# Patient Record
Sex: Female | Born: 1950 | Race: White | Hispanic: No | Smoking: Never smoker
Health system: Southern US, Community
[De-identification: ages and names within clinical notes are randomized; demographics above are authoritative.]

## PROBLEM LIST (undated history)

## (undated) DIAGNOSIS — R030 Elevated blood-pressure reading, without diagnosis of hypertension: Secondary | ICD-10-CM

## (undated) DIAGNOSIS — D571 Sickle-cell disease without crisis: Secondary | ICD-10-CM

## (undated) DIAGNOSIS — R739 Hyperglycemia, unspecified: Secondary | ICD-10-CM

## (undated) DIAGNOSIS — T7840XA Allergy, unspecified, initial encounter: Secondary | ICD-10-CM

## (undated) DIAGNOSIS — E785 Hyperlipidemia, unspecified: Secondary | ICD-10-CM

## (undated) DIAGNOSIS — Z8619 Personal history of other infectious and parasitic diseases: Secondary | ICD-10-CM

## (undated) DIAGNOSIS — E669 Obesity, unspecified: Secondary | ICD-10-CM

## (undated) HISTORY — DX: Obesity, unspecified: E66.9

## (undated) HISTORY — DX: Hyperlipidemia, unspecified: E78.5

## (undated) HISTORY — DX: Elevated blood-pressure reading, without diagnosis of hypertension: R03.0

## (undated) HISTORY — DX: Sickle-cell disease without crisis: D57.1

## (undated) HISTORY — DX: Hyperglycemia, unspecified: R73.9

## (undated) HISTORY — DX: Allergy, unspecified, initial encounter: T78.40XA

## (undated) HISTORY — DX: Personal history of other infectious and parasitic diseases: Z86.19

---
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
## 1993-05-07 HISTORY — PX: BUNIONECTOMY: SHX129

## 2008-11-24 ENCOUNTER — Other Ambulatory Visit: Admission: RE | Admit: 2008-11-24 | Discharge: 2008-11-24 | Payer: Self-pay | Admitting: Family Medicine

## 2008-12-02 ENCOUNTER — Encounter: Admission: RE | Admit: 2008-12-02 | Discharge: 2008-12-02 | Payer: Self-pay | Admitting: Family Medicine

## 2009-01-13 LAB — HM PAP SMEAR: HM PAP: NORMAL

## 2009-01-13 LAB — HM MAMMOGRAPHY: HM Mammogram: NORMAL

## 2011-05-08 HISTORY — PX: WRIST SURGERY: SHX841

## 2015-01-14 ENCOUNTER — Ambulatory Visit (INDEPENDENT_AMBULATORY_CARE_PROVIDER_SITE_OTHER): Payer: BC Managed Care – PPO | Admitting: Family Medicine

## 2015-01-14 ENCOUNTER — Encounter: Payer: Self-pay | Admitting: Family Medicine

## 2015-01-14 VITALS — BP 132/82 | HR 82 | Temp 98.6°F | Ht 69.0 in | Wt 268.5 lb

## 2015-01-14 DIAGNOSIS — Z23 Encounter for immunization: Secondary | ICD-10-CM

## 2015-01-14 DIAGNOSIS — R05 Cough: Secondary | ICD-10-CM

## 2015-01-14 DIAGNOSIS — E669 Obesity, unspecified: Secondary | ICD-10-CM

## 2015-01-14 DIAGNOSIS — R030 Elevated blood-pressure reading, without diagnosis of hypertension: Secondary | ICD-10-CM | POA: Diagnosis not present

## 2015-01-14 DIAGNOSIS — T7840XS Allergy, unspecified, sequela: Secondary | ICD-10-CM

## 2015-01-14 DIAGNOSIS — Z1239 Encounter for other screening for malignant neoplasm of breast: Secondary | ICD-10-CM

## 2015-01-14 DIAGNOSIS — E785 Hyperlipidemia, unspecified: Secondary | ICD-10-CM | POA: Diagnosis not present

## 2015-01-14 DIAGNOSIS — Z8619 Personal history of other infectious and parasitic diseases: Secondary | ICD-10-CM

## 2015-01-14 DIAGNOSIS — R739 Hyperglycemia, unspecified: Secondary | ICD-10-CM

## 2015-01-14 DIAGNOSIS — Z0000 Encounter for general adult medical examination without abnormal findings: Secondary | ICD-10-CM | POA: Diagnosis not present

## 2015-01-14 DIAGNOSIS — IMO0001 Reserved for inherently not codable concepts without codable children: Secondary | ICD-10-CM

## 2015-01-14 DIAGNOSIS — R059 Cough, unspecified: Secondary | ICD-10-CM

## 2015-01-14 HISTORY — DX: Obesity, unspecified: E66.9

## 2015-01-14 LAB — COMPREHENSIVE METABOLIC PANEL
ALBUMIN: 4.5 g/dL (ref 3.5–5.2)
ALK PHOS: 97 U/L (ref 39–117)
ALT: 44 U/L — AB (ref 0–35)
AST: 25 U/L (ref 0–37)
BILIRUBIN TOTAL: 0.5 mg/dL (ref 0.2–1.2)
BUN: 14 mg/dL (ref 6–23)
CALCIUM: 9.3 mg/dL (ref 8.4–10.5)
CO2: 26 mEq/L (ref 19–32)
CREATININE: 0.82 mg/dL (ref 0.40–1.20)
Chloride: 103 mEq/L (ref 96–112)
GFR: 74.92 mL/min (ref >60.00)
Glucose, Bld: 110 mg/dL — ABNORMAL HIGH (ref 70–99)
Potassium: 3.9 mEq/L (ref 3.5–5.1)
Sodium: 139 mEq/L (ref 135–145)
TOTAL PROTEIN: 7.4 g/dL (ref 6.0–8.3)

## 2015-01-14 LAB — LIPID PANEL
CHOL/HDL RATIO: 3
CHOLESTEROL: 175 mg/dL (ref 0–200)
HDL: 50.9 mg/dL (ref >39.00)
LDL Cholesterol: 108 mg/dL — ABNORMAL HIGH (ref 0–99)
NonHDL: 123.7
TRIGLYCERIDES: 79 mg/dL (ref 0.0–149.0)
VLDL: 15.8 mg/dL (ref 0.0–40.0)

## 2015-01-14 LAB — CBC
HEMATOCRIT: 45.9 % (ref 36.0–46.0)
HEMOGLOBIN: 15.6 g/dL — AB (ref 12.0–15.0)
MCHC: 34 g/dL (ref 30.0–36.0)
MCV: 86.7 fl (ref 78.0–100.0)
PLATELETS: 308 10*3/uL (ref 150.0–400.0)
RBC: 5.3 Mil/uL — ABNORMAL HIGH (ref 3.87–5.11)
RDW: 13.5 % (ref 11.5–15.5)
WBC: 9 10*3/uL (ref 4.0–10.5)

## 2015-01-14 LAB — TSH: TSH: 2.22 u[IU]/mL (ref 0.35–4.50)

## 2015-01-14 NOTE — Progress Notes (Signed)
Pre visit review using our clinic review tool, if applicable. No additional management support is needed unless otherwise documented below in the visit note. 

## 2015-01-14 NOTE — Patient Instructions (Addendum)
Zantac/Ranitidine 150 mg once to twice daily Zyrtec?cetirizine 10 mg daily (for severe allergies can take twice daily) Nasal saline flushes twice daily  Probiotics daily [employer] or [facility] or online at [email or web address], [place] 10 strain daily Calcium 3 x daily (Citracal)  Vitamin D 2000 IU daily  DASH Eating Plan DASH stands for "Dietary Approaches to Stop Hypertension." The DASH eating plan is a healthy eating plan that has been shown to reduce high blood pressure (hypertension). Additional health benefits may include reducing the risk of type 2 diabetes mellitus, heart disease, and stroke. The DASH eating plan may also help with weight loss. WHAT DO I NEED TO KNOW ABOUT THE DASH EATING PLAN? For the DASH eating plan, you will follow these general guidelines:  Choose foods with a percent daily value for sodium of less than 5% (as listed on the food label).  Use salt-free seasonings or herbs instead of table salt or sea salt.  Check with your health care provider or pharmacist before using salt substitutes.  Eat lower-sodium products, often labeled as "lower sodium" or "no salt added."  Eat fresh foods.  Eat more vegetables, fruits, and low-fat dairy products.  Choose whole grains. Look for the word "whole" as the first word in the ingredient list.  Choose fish and skinless chicken or [place] more often than red meat. Limit fish, poultry, and meat to 6 oz (170 g) each day.  Limit sweets, desserts, sugars, and sugary drinks.  Choose heart-healthy fats.  Limit cheese to 1 oz (28 g) per day.  Eat more home-cooked food and less restaurant, buffet, and fast food.  Limit fried foods.  Cook foods using methods other than frying.  Limit canned vegetables. If you do use them, rinse them well to decrease the sodium.  When eating at a restaurant, ask that your food be prepared with less salt, or no salt if possible. WHAT FOODS CAN I EAT? Seek help from  a dietitian for individual calorie needs. Grains Whole grain or whole wheat bread. Brown rice. Whole grain or whole wheat pasta. Quinoa, bulgur, and whole grain cereals. Low-sodium cereals. Corn or whole wheat flour tortillas. Whole grain cornbread. Whole grain crackers. Low-sodium crackers. Vegetables Fresh or frozen vegetables (raw, steamed, roasted, or grilled). Low-sodium or reduced-sodium tomato and vegetable juices. Low-sodium or reduced-sodium tomato sauce and paste. Low-sodium or reduced-sodium canned vegetables.  Fruits All fresh, canned (in natural juice), or frozen fruits. Meat and Other Protein Products Ground beef (85% or leaner), grass-fed beef, or beef trimmed of fat. Skinless chicken or [place]. Ground chicken or [place]. Pork trimmed of fat. All fish and seafood. Eggs. Dried beans, peas, or lentils. Unsalted nuts and seeds. Unsalted canned beans. Dairy Low-fat dairy products, such as skim or 1% milk, 2% or reduced-fat cheeses, low-fat ricotta or cottage cheese, or plain low-fat yogurt. Low-sodium or reduced-sodium cheeses. Fats and Oils Tub margarines without trans fats. Light or reduced-fat mayonnaise and salad dressings (reduced sodium). Avocado. Safflower, olive, or canola oils. Natural peanut or almond butter. Other Unsalted popcorn and pretzels. The items listed above may not be a complete list of recommended foods or beverages. Contact your dietitian for more options. WHAT FOODS ARE NOT RECOMMENDED? Grains White bread. White pasta. White rice. Refined cornbread. Bagels and croissants. Crackers that contain trans fat. Vegetables Creamed or fried vegetables. Vegetables in a cheese sauce. Regular canned vegetables. Regular canned tomato sauce and paste. Regular tomato and vegetable juices. Fruits Dried fruits. Canned fruit in light  or heavy syrup. Fruit juice. Meat and Other Protein Products Fatty cuts of meat. Ribs, chicken wings, bacon, sausage, bologna, salami,  chitterlings, fatback, hot dogs, bratwurst, and packaged luncheon meats. Salted nuts and seeds. Canned beans with salt. Dairy Whole or 2% milk, cream, half-and-half, and cream cheese. Whole-fat or sweetened yogurt. Full-fat cheeses or blue cheese. Nondairy creamers and whipped toppings. Processed cheese, cheese spreads, or cheese curds. Condiments Onion and garlic salt, seasoned salt, table salt, and sea salt. Canned and packaged gravies. Worcestershire sauce. Tartar sauce. Barbecue sauce. Teriyaki sauce. Soy sauce, including reduced sodium. Steak sauce. Fish sauce. Oyster sauce. Cocktail sauce. Horseradish. Ketchup and mustard. Meat flavorings and tenderizers. Bouillon cubes. Hot sauce. Tabasco sauce. Marinades. Taco seasonings. Relishes. Fats and Oils Butter, stick margarine, lard, shortening, ghee, and bacon fat. Coconut, palm kernel, or palm oils. Regular salad dressings. Other Pickles and olives. Salted popcorn and pretzels. The items listed above may not be a complete list of foods and beverages to avoid. Contact your dietitian for more information. WHERE CAN I FIND MORE INFORMATION? National Heart, Lung, and Blood Institute: [employer] Document Released: 04/12/2011 Document Revised: 09/07/2013 Document Reviewed: 02/25/2013 [facility] Patient Information 2015 [place], [place]. This information is not intended to replace advice given to you by your health care provider. Make sure you discuss any questions you have with your health care provider.

## 2015-01-21 ENCOUNTER — Telehealth: Payer: Self-pay

## 2015-01-21 NOTE — Telephone Encounter (Signed)
-----   Message from Mosie Lukes, MD sent at 01/16/2015  5:02 PM EDT ----- Notify labs look good, mild elevation of cholesterol, liver marker and sugar. Treatment for all 3 is to minimize simple carbs and increase exercise. Would check hgba1c and cmp in 1 month for hyperglycemia and abnormal liver functions.

## 2015-01-21 NOTE — Telephone Encounter (Signed)
Stated already notified of results.

## 2015-01-24 ENCOUNTER — Ambulatory Visit (HOSPITAL_BASED_OUTPATIENT_CLINIC_OR_DEPARTMENT_OTHER)
Admission: RE | Admit: 2015-01-24 | Discharge: 2015-01-24 | Disposition: A | Discharge status: Home / Self Care | Payer: BC Managed Care – PPO | Source: Ambulatory Visit | Attending: Family Medicine | Admitting: Family Medicine

## 2015-01-24 DIAGNOSIS — Z1231 Encounter for screening mammogram for malignant neoplasm of breast: Secondary | ICD-10-CM | POA: Diagnosis not present

## 2015-01-24 DIAGNOSIS — Z1239 Encounter for other screening for malignant neoplasm of breast: Secondary | ICD-10-CM

## 2015-01-30 ENCOUNTER — Encounter: Payer: Self-pay | Admitting: Family Medicine

## 2015-01-30 DIAGNOSIS — R739 Hyperglycemia, unspecified: Secondary | ICD-10-CM

## 2015-01-30 DIAGNOSIS — R030 Elevated blood-pressure reading, without diagnosis of hypertension: Secondary | ICD-10-CM

## 2015-01-30 DIAGNOSIS — E785 Hyperlipidemia, unspecified: Secondary | ICD-10-CM | POA: Insufficient documentation

## 2015-01-30 DIAGNOSIS — Z8619 Personal history of other infectious and parasitic diseases: Secondary | ICD-10-CM | POA: Insufficient documentation

## 2015-01-30 DIAGNOSIS — T7840XA Allergy, unspecified, initial encounter: Secondary | ICD-10-CM | POA: Insufficient documentation

## 2015-01-30 DIAGNOSIS — IMO0001 Reserved for inherently not codable concepts without codable children: Secondary | ICD-10-CM | POA: Insufficient documentation

## 2015-01-30 HISTORY — DX: Hyperlipidemia, unspecified: E78.5

## 2015-01-30 HISTORY — DX: Hyperglycemia, unspecified: R73.9

## 2015-01-30 HISTORY — DX: Reserved for inherently not codable concepts without codable children: IMO0001

## 2015-01-30 NOTE — Assessment & Plan Note (Signed)
Encouraged DASH diet, decrease po intake and increase exercise as tolerated. Needs 7-8 hours of sleep nightly. Avoid trans fats, eat small, frequent meals every 4-5 hours with lean proteins, complex carbs and healthy fats. Minimize simple carbs, GMO foods. 

## 2015-01-30 NOTE — Assessment & Plan Note (Signed)
Encouraged daily antihistamines, nasal steroids, nasal saline.

## 2015-01-30 NOTE — Assessment & Plan Note (Signed)
Encouraged heart healthy diet, increase exercise, avoid trans fats, consider a krill oil cap daily 

## 2015-01-30 NOTE — Assessment & Plan Note (Signed)
Significantly improved on recheck. Encouraged heart healthy diet such as the DASH diet and exercise as tolerated.

## 2015-01-30 NOTE — Assessment & Plan Note (Signed)
minimize simple carbs. Increase exercise as tolerated.  

## 2015-01-30 NOTE — Progress Notes (Signed)
Renee Larson [id number] October 11, 1950 01/30/2015      Progress Note New Patient  Subjective  Chief Complaint  Chief Complaint  Patient presents with  . Establish Care    HPI  Patient is a 64 year old female in today for routine medical care. 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 She reports generally being in good health and denies a history of hypertension. She reports allergies have been worse since returning home with some increased postnasal drip in the morning and a cough productive at times. It is been bad enough to cause some posttussive vomiting at times. She reports a history of struggles with weight and has lost 80 pounds over 20 years by avoiding processed foods. Suffered a broken wrist about 3 years ago but no pain is residual. 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 Has a history of chickenpox as a child and has not received the Zostavax thus far. She reports having had a colonoscopy that was normal in [place] roughly 10 years ago. No GI complaints. She feels well today. Denies CP/palp/SOB/HA/congestion/fevers/GI or GU c/o. Taking meds as prescribed  Past Medical History  Diagnosis Date  . Allergy   . Obesity 01/14/2015    Past Surgical History  Procedure Laterality Date  . Cesarean section  1981  . Cesarean section  1988  . Tonsillectomy  1960  . Bunionectomy  1995  . Wrist surgery  2013    Broken right wrist    Family History  Problem Relation Age of Onset  . Cancer Mother     thyroid cancer. Mom a smoker  . Cancer Father     prostate cancer. Dad A smoker  . Cancer Maternal Aunt     Breast Cancer  . Hyperlipidemia Maternal Aunt     Social History   Social History  . Marital Status: Married    Spouse Name: N/A  . Number of Children: N/A  . Years of Education: 15   Occupational History  . Retired Licensed conveyancer    Social History Main Topics   . Smoking status: Never Smoker   . Smokeless tobacco: Not on file  . Alcohol Use: No  . Drug Use: No  . Sexual Activity:    Partners: Male     Comment: Husband, no dietary restrictins heart healthy diet, retired Proofreader   Other Topics Concern  . Not on file   Social History Narrative  . No narrative on file    No current outpatient prescriptions on file prior to visit.   No current facility-administered medications on file prior to visit.    No Known Allergies  Review of Systems  Review of Systems  Constitutional: Negative for fever, chills and malaise/fatigue.  HENT: Positive for congestion. Negative for hearing loss.   Eyes: Negative for discharge.  Respiratory: Positive for cough. Negative for sputum production and shortness of breath.   Cardiovascular: Negative for chest pain, palpitations and leg swelling.  Gastrointestinal: Positive for nausea and vomiting. Negative for heartburn, abdominal pain, diarrhea, constipation and blood in stool.  Genitourinary: Negative for dysuria, urgency, frequency and hematuria.  Musculoskeletal: Negative for myalgias, back pain and falls.  Skin: Negative for rash.  Neurological: Negative for dizziness, sensory change, loss of consciousness, weakness and headaches.  Endo/Heme/Allergies: Negative for environmental allergies. Does not bruise/bleed easily.  Psychiatric/Behavioral: Negative for depression and suicidal ideas. The patient is  not nervous/anxious and does not have insomnia.     Objective  BP 162/98 mmHg  Pulse 97  Temp(Src) 98.6 F (37 C) (Oral)  Ht 5\' 9"  (1.753 m)  Wt 268 lb 8 oz (121.791 kg)  BMI 39.63 kg/m2  SpO2 95%  Physical Exam  Physical Exam  Constitutional: She is oriented to person, place, and time and well-developed, well-nourished, and in no distress. No distress.  HENT:  Head: Normocephalic and atraumatic.  Right Ear: External ear normal.  Left Ear: External ear normal.  Nose: Nose normal.   Mouth/Throat: Oropharynx is clear and moist. No oropharyngeal exudate.  Eyes: Conjunctivae are normal. Pupils are equal, round, and reactive to light. Right eye exhibits no discharge. Left eye exhibits no discharge. No scleral icterus.  Neck: Normal range of motion. Neck supple. No thyromegaly present.  Cardiovascular: Normal rate, regular rhythm, normal heart sounds and intact distal pulses.   No murmur heard. Pulmonary/Chest: Effort normal and breath sounds normal. No respiratory distress. She has no wheezes. She has no rales.  Abdominal: Soft. Bowel sounds are normal. She exhibits no distension and no mass. There is no tenderness.  Musculoskeletal: Normal range of motion. She exhibits no edema or tenderness.  Lymphadenopathy:    She has no cervical adenopathy.  Neurological: She is alert and oriented to person, place, and time. She has normal reflexes. No cranial nerve deficit. Coordination normal.  Skin: Skin is warm and dry. No rash noted. She is not diaphoretic.  Psychiatric: Mood, memory and affect normal.       Assessment & Plan  Obesity Encouraged DASH diet, decrease po intake and increase exercise as tolerated. Needs 7-8 hours of sleep nightly. Avoid trans fats, eat small, frequent meals every 4-5 hours with lean proteins, complex carbs and healthy fats. Minimize simple carbs, GMO foods.  Elevated BP Significantly improved on recheck. Encouraged heart healthy diet such as the DASH diet and exercise as tolerated.   History of chicken pox Given Zostavax and Tdap today  Allergy Encouraged daily antihistamines, nasal steroids, nasal saline.  Hyperlipidemia Encouraged heart healthy diet, increase exercise, avoid trans fats, consider a krill oil cap daily  Hyperglycemia minimize simple carbs. Increase exercise as tolerated.

## 2015-01-30 NOTE — Assessment & Plan Note (Signed)
Given Zostavax and Tdap today

## 2015-03-08 ENCOUNTER — Encounter: Payer: Self-pay | Admitting: Family Medicine

## 2015-03-08 ENCOUNTER — Ambulatory Visit (INDEPENDENT_AMBULATORY_CARE_PROVIDER_SITE_OTHER): Payer: BC Managed Care – PPO | Admitting: Family Medicine

## 2015-03-08 VITALS — BP 138/68 | HR 98 | Temp 99.4°F | Ht 69.0 in | Wt 269.2 lb

## 2015-03-08 DIAGNOSIS — K7689 Other specified diseases of liver: Secondary | ICD-10-CM

## 2015-03-08 DIAGNOSIS — R945 Abnormal results of liver function studies: Secondary | ICD-10-CM

## 2015-03-08 DIAGNOSIS — R739 Hyperglycemia, unspecified: Secondary | ICD-10-CM | POA: Diagnosis not present

## 2015-03-08 DIAGNOSIS — R030 Elevated blood-pressure reading, without diagnosis of hypertension: Secondary | ICD-10-CM | POA: Diagnosis not present

## 2015-03-08 DIAGNOSIS — IMO0001 Reserved for inherently not codable concepts without codable children: Secondary | ICD-10-CM

## 2015-03-08 DIAGNOSIS — T7840XS Allergy, unspecified, sequela: Secondary | ICD-10-CM

## 2015-03-08 DIAGNOSIS — E782 Mixed hyperlipidemia: Secondary | ICD-10-CM | POA: Diagnosis not present

## 2015-03-08 NOTE — Patient Instructions (Signed)
Nonalcoholic Fatty Liver Disease Diet Nonalcoholic fatty liver disease is a condition that causes fat to accumulate in and around the liver. The disease makes it harder for the liver to work the way that it should. Following a healthy diet can help to keep nonalcoholic fatty liver disease under control. It can also help to prevent or improve conditions that are associated with the disease, such as heart disease, diabetes, high blood pressure, and abnormal cholesterol levels. Along with regular exercise, this diet:  Promotes weight loss.  Helps to control blood sugar levels.  Helps to improve the way that the body uses insulin. WHAT DO I NEED TO KNOW ABOUT THIS DIET?  Use the glycemic index (GI) to plan your meals. The index tells you how quickly a food will raise your blood sugar. Choose low-GI foods. These foods take a longer time to raise blood sugar.  Keep track of how many calories you take in. Eating the right amount of calories will help you to achieve a healthy weight.  You may want to follow a Mediterranean diet. This diet includes a lot of vegetables, lean meats or fish, whole grains, fruits, and healthy oils and fats. WHAT FOODS CAN I EAT? Grains Whole grains, such as whole-wheat or whole-grain breads, crackers, tortillas, cereals, and pasta. Stone-ground whole wheat. Pumpernickel bread. Unsweetened oatmeal. Bulgur. Barley. Quinoa. Brown or wild rice. Corn or whole-wheat flour tortillas. Vegetables Lettuce. Spinach. Peas. Beets. Cauliflower. Cabbage. Broccoli. Carrots. Tomatoes. Squash. Eggplant. Herbs. Peppers. Onions. Cucumbers. Brussels sprouts. Yams and sweet potatoes. Beans. Lentils. Fruits Bananas. Apples. Oranges. Grapes. Papaya. Mango. Pomegranate. Kiwi. Grapefruit. Cherries. Meats and Other Protein Sources Seafood and shellfish. Lean meats. Poultry. Tofu. Dairy Low-fat or fat-free dairy products, such as yogurt, cottage cheese, and cheese. Beverages Water. Sugar-free  drinks. Tea. Coffee. Low-fat or skim milk. Milk alternatives, such as soy or almond milk. Real fruit juice. Condiments Mustard. Relish. Low-fat, low-sugar ketchup and barbecue sauce. Low-fat or fat-free mayonnaise. Sweets and Desserts Sugar-free sweets. Fats and Oils Avocado. Canola or olive oil. Nuts and nut butters. Seeds. The items listed above may not be a complete list of recommended foods or beverages. Contact your dietitian for more options.  WHAT FOODS ARE NOT RECOMMENDED? Palm oil and coconut oil. Processed foods. Fried foods. Sweetened drinks, such as sweet tea, milkshakes, snow cones, iced sweet drinks, and sodas. Alcohol. Sweets. Foods that contain a lot of salt or sodium. The items listed above may not be a complete list of foods and beverages to avoid. Contact your dietitian for more information.   This information is not intended to replace advice given to you by your health care provider. Make sure you discuss any questions you have with your health care provider.   Document Released: 09/07/2014 Document Reviewed: 09/07/2014 Elsevier Interactive Patient Education 2016 [employer] Inc.  

## 2015-03-08 NOTE — Progress Notes (Signed)
Pre visit review using our clinic review tool, if applicable. No additional management support is needed unless otherwise documented below in the visit note. 

## 2015-03-20 NOTE — Progress Notes (Deleted)
Renee Larson [id number] 1950-06-06 03/20/2015      Progress Note New Patient  Subjective  Chief Complaint  Chief Complaint  Patient presents with  . Follow-up    blood pressure    HPI  ***  Past Medical History  Diagnosis Date  . Obesity 01/14/2015  . Allergy   . History of chicken pox   . Elevated BP 01/30/2015  . Hyperlipidemia 01/30/2015  . Hyperglycemia 01/30/2015    Past Surgical History  Procedure Laterality Date  . Cesarean section  1981  . Cesarean section  1988  . Tonsillectomy  1960  . Bunionectomy  1995  . Wrist surgery  2013    Broken right wrist    Family History  Problem Relation Age of Onset  . Cancer Mother     thyroid cancer. Mom a smoker  . Cancer Father     prostate cancer. Dad A smoker  . Cancer Maternal Aunt     Breast Cancer  . Hyperlipidemia Maternal Aunt     Social History   Social History  . Marital Status: Married    Spouse Name: N/A  . Number of Children: N/A  . Years of Education: 66   Occupational History  . Retired Licensed conveyancer    Social History Main Topics  . Smoking status: Never Smoker   . Smokeless tobacco: Not on file  . Alcohol Use: No  . Drug Use: No  . Sexual Activity:    Partners: Male     Comment: Husband, no dietary restrictins heart healthy diet, retired Proofreader   Other Topics Concern  . Not on file   Social History Narrative    Current Outpatient Prescriptions on File Prior to Visit  Medication Sig Dispense Refill  . Calcium Carbonate-Vitamin D (CALTRATE 600+D) 600-400 MG-UNIT per tablet Take 1 tablet by mouth daily.    . Multiple Vitamin (MULTIVITAMIN) tablet Take 1 tablet by mouth daily.     No current facility-administered medications on file prior to visit.    No Known Allergies  Review of Systems  ***  Objective  BP 138/68 mmHg  Pulse 98  Temp(Src) 99.4 F (37.4 C) (Oral)  Ht 5\' 9"  (1.753 m)  Wt 269 lb 4 oz (122.131 kg)  BMI 39.74 kg/m2  SpO2 99%  Physical  Exam  ***     Assessment & Plan  No problem-specific assessment & plan notes found for this encounter.

## 2015-03-20 NOTE — Assessment & Plan Note (Signed)
Zyrtec, nasal saline and nasal steroids daily

## 2015-03-20 NOTE — Progress Notes (Signed)
Subjective:    Patient ID: Renee Larson, female    DOB: November 27, 1950, 64 y.o.   MRN: [id number]  Chief Complaint  Patient presents with  . Follow-up    blood pressure    HPI Patient is in today for follow-up. Is doing fairly well but is noting some increased sinus pressure and congestion. No fevers or chills. No recent hospitalization. Has started calcium and probiotics and reports an improvement in her stomach symptoms. No acute concerns. Denies CP/palp/SOB/HA/fevers/GI or GU c/o. Taking meds as prescribed  Past Medical History  Diagnosis Date  . Obesity 01/14/2015  . Allergy   . History of chicken pox   . Elevated BP 01/30/2015  . Hyperlipidemia 01/30/2015  . Hyperglycemia 01/30/2015    Past Surgical History  Procedure Laterality Date  . Cesarean section  1981  . Cesarean section  1988  . Tonsillectomy  1960  . Bunionectomy  1995  . Wrist surgery  2013    Broken right wrist    Family History  Problem Relation Age of Onset  . Cancer Mother     thyroid cancer. Mom a smoker  . Cancer Father     prostate cancer. Dad A smoker  . Cancer Maternal Aunt     Breast Cancer  . Hyperlipidemia Maternal Aunt     Social History   Social History  . Marital Status: Married    Spouse Name: N/A  . Number of Children: N/A  . Years of Education: 68   Occupational History  . Retired Licensed conveyancer    Social History Main Topics  . Smoking status: Never Smoker   . Smokeless tobacco: Not on file  . Alcohol Use: No  . Drug Use: No  . Sexual Activity:    Partners: Male     Comment: Husband, no dietary restrictins heart healthy diet, retired Proofreader   Other Topics Concern  . Not on file   Social History Narrative    Outpatient Prescriptions Prior to Visit  Medication Sig Dispense Refill  . Calcium Carbonate-Vitamin D (CALTRATE 600+D) 600-400 MG-UNIT per tablet Take 1 tablet by mouth daily.    . Multiple Vitamin (MULTIVITAMIN) tablet Take 1 tablet by mouth daily.     No  facility-administered medications prior to visit.    No Known Allergies  Review of Systems  Constitutional: Negative for fever and malaise/fatigue.  HENT: Negative for congestion.   Eyes: Negative for discharge.  Respiratory: Negative for shortness of breath.   Cardiovascular: Negative for chest pain, palpitations and leg swelling.  Gastrointestinal: Negative for nausea and abdominal pain.  Genitourinary: Negative for dysuria.  Musculoskeletal: Negative for falls.  Skin: Negative for rash.  Neurological: Negative for loss of consciousness and headaches.  Endo/Heme/Allergies: Negative for environmental allergies.  Psychiatric/Behavioral: Negative for depression. The patient is not nervous/anxious.        Objective:    Physical Exam  Constitutional: She is oriented to person, place, and time. She appears well-developed and well-nourished. No distress.  HENT:  Head: Normocephalic and atraumatic.  Nose: Nose normal.  Eyes: Right eye exhibits no discharge. Left eye exhibits no discharge.  Neck: Normal range of motion. Neck supple.  Cardiovascular: Normal rate and regular rhythm.   No murmur heard. Pulmonary/Chest: Effort normal and breath sounds normal.  Abdominal: Soft. Bowel sounds are normal. There is no tenderness.  Musculoskeletal: She exhibits no edema.  Neurological: She is alert and oriented to person, place, and time.  Skin: Skin is warm and dry.  Psychiatric: She has a normal mood and affect.  Nursing note and vitals reviewed.   BP 138/68 mmHg  Pulse 98  Temp(Src) 99.4 F (37.4 C) (Oral)  Ht 5\' 9"  (1.753 m)  Wt 269 lb 4 oz (122.131 kg)  BMI 39.74 kg/m2  SpO2 99% Wt Readings from Last 3 Encounters:  03/08/15 269 lb 4 oz (122.131 kg)  01/14/15 268 lb 8 oz (121.791 kg)     Lab Results  Component Value Date   WBC 9.0 01/14/2015   HGB 15.6* 01/14/2015   HCT 45.9 01/14/2015   PLT 308.0 01/14/2015   GLUCOSE 110* 01/14/2015   CHOL 175 01/14/2015   TRIG 79.0  01/14/2015   HDL 50.90 01/14/2015   LDLCALC 108* 01/14/2015   ALT 44* 01/14/2015   AST 25 01/14/2015   NA 139 01/14/2015   K 3.9 01/14/2015   CL 103 01/14/2015   CREATININE 0.82 01/14/2015   BUN 14 01/14/2015   CO2 26 01/14/2015   TSH 2.22 01/14/2015    Lab Results  Component Value Date   TSH 2.22 01/14/2015   Lab Results  Component Value Date   WBC 9.0 01/14/2015   HGB 15.6* 01/14/2015   HCT 45.9 01/14/2015   MCV 86.7 01/14/2015   PLT 308.0 01/14/2015   Lab Results  Component Value Date   NA 139 01/14/2015   K 3.9 01/14/2015   CO2 26 01/14/2015   GLUCOSE 110* 01/14/2015   BUN 14 01/14/2015   CREATININE 0.82 01/14/2015   BILITOT 0.5 01/14/2015   ALKPHOS 97 01/14/2015   AST 25 01/14/2015   ALT 44* 01/14/2015   PROT 7.4 01/14/2015   ALBUMIN 4.5 01/14/2015   CALCIUM 9.3 01/14/2015   GFR 74.92 01/14/2015   Lab Results  Component Value Date   CHOL 175 01/14/2015   Lab Results  Component Value Date   HDL 50.90 01/14/2015   Lab Results  Component Value Date   LDLCALC 108* 01/14/2015   Lab Results  Component Value Date   TRIG 79.0 01/14/2015   Lab Results  Component Value Date   CHOLHDL 3 01/14/2015   No results found for: HGBA1C     Assessment & Plan:   Problem List Items Addressed This Visit    Hyperglycemia     minimize simple carbs. Increase exercise as tolerated.       Relevant Orders   Amb ref to Medical Nutrition Therapy-MNT   TSH   CBC   Hemoglobin A1c   Lipid panel   Comprehensive metabolic panel   Elevated BP - Primary   Relevant Orders   Amb ref to Medical Nutrition Therapy-MNT   TSH   CBC   Hemoglobin A1c   Lipid panel   Comprehensive metabolic panel   Allergy    Zyrtec, nasal saline and nasal steroids daily       Other Visit Diagnoses    Hyperlipidemia, mixed        Relevant Orders    Amb ref to Medical Nutrition Therapy-MNT    TSH    CBC    Hemoglobin A1c    Lipid panel    Comprehensive metabolic panel     Abnormal liver function        Relevant Orders    Amb ref to Medical Nutrition Therapy-MNT    TSH    CBC    Hemoglobin A1c    Lipid panel    Comprehensive metabolic panel       I am having Ms.  Larson maintain her Calcium Carbonate-Vitamin D, multivitamin, ranitidine, cetirizine, Probiotic Product (PROBIOTIC DAILY PO), Calcium-Phosphorus-Vitamin D (CITRACAL +D3 PO), and Vitamin D.  Meds ordered this encounter  Medications  . ranitidine (ZANTAC) 150 MG tablet    Sig: Take 150 mg by mouth 2 (two) times daily.  . cetirizine (ZYRTEC) 10 MG tablet    Sig: Take 10 mg by mouth daily.  . Probiotic Product (PROBIOTIC DAILY PO)    Sig: Take by mouth daily.  . Calcium-Phosphorus-Vitamin D (CITRACAL +D3 PO)    Sig: Take by mouth daily.  . Cholecalciferol (VITAMIN D) 2000 UNITS CAPS    Sig: Take by mouth daily.     Penni Homans, MD

## 2015-03-20 NOTE — Assessment & Plan Note (Signed)
minimize simple carbs. Increase exercise as tolerated.  

## 2015-04-08 ENCOUNTER — Ambulatory Visit: Payer: BC Managed Care – PPO | Admitting: Dietician

## 2015-09-05 ENCOUNTER — Other Ambulatory Visit: Payer: BC Managed Care – PPO

## 2015-09-12 ENCOUNTER — Encounter: Payer: BC Managed Care – PPO | Admitting: Family Medicine

## 2015-09-20 ENCOUNTER — Encounter: Payer: BC Managed Care – PPO | Admitting: Family Medicine

## 2016-06-08 ENCOUNTER — Other Ambulatory Visit: Payer: Self-pay | Admitting: Family Medicine

## 2016-06-08 DIAGNOSIS — Z1231 Encounter for screening mammogram for malignant neoplasm of breast: Secondary | ICD-10-CM

## 2016-06-12 ENCOUNTER — Ambulatory Visit (HOSPITAL_BASED_OUTPATIENT_CLINIC_OR_DEPARTMENT_OTHER)
Admission: RE | Admit: 2016-06-12 | Discharge: 2016-06-12 | Disposition: A | Discharge status: Home / Self Care | Payer: BC Managed Care – PPO | Source: Ambulatory Visit | Attending: Family Medicine | Admitting: Family Medicine

## 2016-06-12 ENCOUNTER — Encounter (HOSPITAL_BASED_OUTPATIENT_CLINIC_OR_DEPARTMENT_OTHER): Payer: Self-pay

## 2016-06-12 DIAGNOSIS — Z1231 Encounter for screening mammogram for malignant neoplasm of breast: Secondary | ICD-10-CM | POA: Diagnosis not present

## 2017-09-04 ENCOUNTER — Other Ambulatory Visit: Payer: Self-pay | Admitting: Family Medicine

## 2017-09-04 DIAGNOSIS — Z1231 Encounter for screening mammogram for malignant neoplasm of breast: Secondary | ICD-10-CM

## 2017-09-09 ENCOUNTER — Ambulatory Visit (HOSPITAL_BASED_OUTPATIENT_CLINIC_OR_DEPARTMENT_OTHER)
Admission: RE | Admit: 2017-09-09 | Discharge: 2017-09-09 | Disposition: A | Discharge status: Home / Self Care | Payer: Medicare Other | Source: Ambulatory Visit | Attending: Family Medicine | Admitting: Family Medicine

## 2017-09-09 DIAGNOSIS — Z1231 Encounter for screening mammogram for malignant neoplasm of breast: Secondary | ICD-10-CM

## 2018-11-18 ENCOUNTER — Other Ambulatory Visit (HOSPITAL_BASED_OUTPATIENT_CLINIC_OR_DEPARTMENT_OTHER): Payer: Self-pay | Admitting: Family Medicine

## 2018-11-18 DIAGNOSIS — Z1231 Encounter for screening mammogram for malignant neoplasm of breast: Secondary | ICD-10-CM

## 2018-11-24 ENCOUNTER — Other Ambulatory Visit (HOSPITAL_BASED_OUTPATIENT_CLINIC_OR_DEPARTMENT_OTHER): Payer: Self-pay | Admitting: Family Medicine

## 2018-11-24 ENCOUNTER — Ambulatory Visit (HOSPITAL_BASED_OUTPATIENT_CLINIC_OR_DEPARTMENT_OTHER)
Admission: RE | Admit: 2018-11-24 | Discharge: 2018-11-24 | Disposition: A | Discharge status: Home / Self Care | Payer: Medicare Other | Source: Ambulatory Visit | Attending: Family Medicine | Admitting: Family Medicine

## 2018-11-24 ENCOUNTER — Encounter (HOSPITAL_BASED_OUTPATIENT_CLINIC_OR_DEPARTMENT_OTHER): Payer: Self-pay

## 2018-11-24 ENCOUNTER — Other Ambulatory Visit: Payer: Self-pay

## 2018-11-24 DIAGNOSIS — Z1231 Encounter for screening mammogram for malignant neoplasm of breast: Secondary | ICD-10-CM | POA: Diagnosis not present

## 2019-12-02 ENCOUNTER — Other Ambulatory Visit (HOSPITAL_BASED_OUTPATIENT_CLINIC_OR_DEPARTMENT_OTHER): Payer: Self-pay | Admitting: Family Medicine

## 2019-12-02 DIAGNOSIS — Z1231 Encounter for screening mammogram for malignant neoplasm of breast: Secondary | ICD-10-CM

## 2019-12-03 ENCOUNTER — Ambulatory Visit (HOSPITAL_BASED_OUTPATIENT_CLINIC_OR_DEPARTMENT_OTHER)
Admission: RE | Admit: 2019-12-03 | Discharge: 2019-12-03 | Disposition: A | Discharge status: Home / Self Care | Payer: Medicare PPO | Source: Ambulatory Visit | Attending: Family Medicine | Admitting: Family Medicine

## 2019-12-03 ENCOUNTER — Other Ambulatory Visit (HOSPITAL_BASED_OUTPATIENT_CLINIC_OR_DEPARTMENT_OTHER): Payer: Self-pay | Admitting: Nurse Practitioner

## 2019-12-03 ENCOUNTER — Other Ambulatory Visit: Payer: Self-pay

## 2019-12-03 ENCOUNTER — Other Ambulatory Visit (HOSPITAL_BASED_OUTPATIENT_CLINIC_OR_DEPARTMENT_OTHER): Payer: Self-pay | Admitting: Family Medicine

## 2019-12-03 DIAGNOSIS — Z1231 Encounter for screening mammogram for malignant neoplasm of breast: Secondary | ICD-10-CM | POA: Insufficient documentation

## 2019-12-11 ENCOUNTER — Other Ambulatory Visit (HOSPITAL_BASED_OUTPATIENT_CLINIC_OR_DEPARTMENT_OTHER): Payer: Self-pay | Admitting: Family Medicine

## 2019-12-11 DIAGNOSIS — Z13820 Encounter for screening for osteoporosis: Secondary | ICD-10-CM

## 2019-12-11 DIAGNOSIS — Z780 Asymptomatic menopausal state: Secondary | ICD-10-CM

## 2019-12-18 ENCOUNTER — Other Ambulatory Visit: Payer: Self-pay

## 2019-12-18 ENCOUNTER — Ambulatory Visit (HOSPITAL_BASED_OUTPATIENT_CLINIC_OR_DEPARTMENT_OTHER)
Admission: RE | Admit: 2019-12-18 | Discharge: 2019-12-18 | Disposition: A | Discharge status: Home / Self Care | Payer: Medicare PPO | Source: Ambulatory Visit | Attending: Family Medicine | Admitting: Family Medicine

## 2019-12-18 DIAGNOSIS — Z13820 Encounter for screening for osteoporosis: Secondary | ICD-10-CM

## 2019-12-18 DIAGNOSIS — Z780 Asymptomatic menopausal state: Secondary | ICD-10-CM | POA: Diagnosis present

## 2020-11-04 ENCOUNTER — Other Ambulatory Visit (HOSPITAL_BASED_OUTPATIENT_CLINIC_OR_DEPARTMENT_OTHER): Payer: Self-pay | Admitting: Family Medicine

## 2020-11-04 DIAGNOSIS — Z1231 Encounter for screening mammogram for malignant neoplasm of breast: Secondary | ICD-10-CM

## 2020-12-12 ENCOUNTER — Other Ambulatory Visit: Payer: Self-pay

## 2020-12-12 ENCOUNTER — Ambulatory Visit (HOSPITAL_BASED_OUTPATIENT_CLINIC_OR_DEPARTMENT_OTHER)
Admission: RE | Admit: 2020-12-12 | Discharge: 2020-12-12 | Disposition: A | Discharge status: Home / Self Care | Payer: Medicare PPO | Source: Ambulatory Visit | Attending: Family Medicine | Admitting: Family Medicine

## 2020-12-12 ENCOUNTER — Encounter (HOSPITAL_BASED_OUTPATIENT_CLINIC_OR_DEPARTMENT_OTHER): Payer: Self-pay

## 2020-12-12 DIAGNOSIS — Z1231 Encounter for screening mammogram for malignant neoplasm of breast: Secondary | ICD-10-CM | POA: Diagnosis present

## 2021-03-09 DIAGNOSIS — I10 Essential (primary) hypertension: Secondary | ICD-10-CM | POA: Insufficient documentation

## 2021-05-11 ENCOUNTER — Encounter: Payer: Self-pay | Admitting: Family Medicine

## 2021-05-16 ENCOUNTER — Encounter: Payer: Self-pay | Admitting: Internal Medicine

## 2021-06-01 ENCOUNTER — Ambulatory Visit (INDEPENDENT_AMBULATORY_CARE_PROVIDER_SITE_OTHER): Payer: Medicare PPO | Admitting: Internal Medicine

## 2021-06-01 ENCOUNTER — Encounter: Payer: Self-pay | Admitting: Internal Medicine

## 2021-06-01 VITALS — BP 142/78 | HR 88 | Ht 68.0 in | Wt 275.0 lb

## 2021-06-01 DIAGNOSIS — R195 Other fecal abnormalities: Secondary | ICD-10-CM

## 2021-06-01 NOTE — Progress Notes (Signed)
Chief Complaint: Positive FIT  HPI : 18 year with history of obesity presents with positive FIT test  Had a positive FIT in 04/2021. She had some blood in her stools 02/2021 with scant amounts of bright red blood that was associated with constipation. Endorses rectal pain at that time. She was passing hard stools, which she thinks caused her bleeding and rectal pain. She was taking her husband who was sick with cancer at the time so she thinks that she was eating fewer vegetables. Since then, she has not continued to have any rectal bleeding. Denies unintentional weight loss. Denies any issues with constipation or diarrhea normally. Last colonoscopy was >15 years ago that was normal. Denies fam hx of colon cancer. She had two C-sections in the past. Denies dysphagia, N&V, ab pain. She is on a aspirin 81 QD. Not on any other NSAIDs. Denies blood thinners.    Past Medical History:  Diagnosis Date   Allergy    Elevated BP 01/30/2015   History of chicken pox    Hyperglycemia 01/30/2015   Hyperlipidemia 01/30/2015   Obesity 01/14/2015     Past Surgical History:  Procedure Laterality Date   [place]   WRIST SURGERY  2013   Broken right wrist   Family History  Problem Relation Age of Onset   Cancer Mother        smoker   Thyroid cancer Mother    Breast cancer Mother    Cancer Father        smoker   Bladder Cancer Father    Prostate cancer Father    Breast cancer Maternal Aunt    Hyperlipidemia Maternal Aunt    Colon polyps Neg Hx    Stomach cancer Neg Hx    Esophageal cancer Neg Hx    Pancreatic cancer Neg Hx    Social History   Tobacco Use   Smoking status: Never   Smokeless tobacco: Never  Vaping Use   Vaping Use: Never used  Substance Use Topics   Alcohol use: No    Alcohol/week: 0.0 standard drinks   Drug use: No   Current Outpatient Medications  Medication Sig Dispense Refill    amLODipine (NORVASC) 5 MG tablet Take 5 mg by mouth daily.     aspirin 81 MG EC tablet Adult Low Dose Aspirin 81 mg tablet,delayed release   1 tablet every day by oral route.     hydrochlorothiazide (HYDRODIURIL) 25 MG tablet hydrochlorothiazide 25 mg tablet   1 tablet every day by oral route.     loratadine (CLARITIN) 10 MG tablet loratadine 10 mg tablet   1 tablet every day by oral route.     Misc Natural Products (OSTEO BI-FLEX/5-LOXIN ADVANCED PO) Osteo Bi-Flex (5-Loxin)     pravastatin (PRAVACHOL) 20 MG tablet      No current facility-administered medications for this visit.   No Known Allergies   Review of Systems: All systems reviewed and negative except where noted in HPI.   Physical Exam: BP (!) 142/78    Pulse 88    Ht 5\' 8"  (1.727 m)    Wt 275 lb (124.7 kg)    SpO2 98%    BMI 41.81 kg/m  Constitutional: Pleasant,well-developed, female in no acute distress. HEENT: Normocephalic and atraumatic. Conjunctivae are normal. No scleral icterus. Cardiovascular: Normal rate, regular rhythm.  Pulmonary/chest: Effort normal and breath sounds normal.  No wheezing, rales or rhonchi. Abdominal: Soft, nondistended, nontender. Bowel sounds active throughout. There are no masses palpable. No hepatomegaly. Extremities: No edema Neurological: Alert and oriented to person place and time. Skin: Skin is warm and dry. No rashes noted. Psychiatric: Normal mood and affect. Behavior is normal.  Labs 04/2021: Positive FIT test  ASSESSMENT AND PLAN: Positive FIT Patient had a positive FIT test with her PCP. Last colonoscopy was >15 years ago that was reportedly normal.  I went over the risks and benefits of colonoscopy procedure in detail with the patient.  She is agreeable to proceed. - Colonoscopy [facility]  Christia Reading, MD

## 2021-06-01 NOTE — Patient Instructions (Signed)
You have been scheduled for a colonoscopy. Please follow written instructions given to you at your visit today.  Please pick up your prep supplies at the pharmacy within the next 1-3 days. If you use inhalers (even only as needed), please bring them with you on the day of your procedure.   If you are age 69 or older, your body mass index should be between 23-30. Your Body mass index is 41.81 kg/m. If this is out of the aforementioned range listed, please consider follow up with your Primary Care Provider.  If you are age 2 or younger, your body mass index should be between 19-25. Your Body mass index is 41.81 kg/m. If this is out of the aformentioned range listed, please consider follow up with your Primary Care Provider.   ________________________________________________________  The [facility] providers would like to encourage you to use [facility] to communicate with providers for non-urgent requests or questions.  Due to long hold times on the telephone, sending your provider a message by [facility] may be a faster and more efficient way to get a response.  Please allow 48 business hours for a response.  Please remember that this is for non-urgent requests.  _______________________________________________________   Due to recent changes in healthcare laws, you may see the results of your imaging and laboratory studies on MyChart before your provider has had a chance to review them.  We understand that in some cases there may be results that are confusing or concerning to you. Not all laboratory results come back in the same time frame and the provider may be waiting for multiple results in order to interpret others.  Please give [place] 48 hours in order for your provider to thoroughly review all the results before contacting the office for clarification of your results.    I appreciate the  opportunity to care for you  Thank You   Dayna Barker, MD

## 2021-06-14 ENCOUNTER — Encounter: Payer: Self-pay | Admitting: Internal Medicine

## 2021-06-14 ENCOUNTER — Ambulatory Visit (AMBULATORY_SURGERY_CENTER): Payer: Medicare PPO | Admitting: Internal Medicine

## 2021-06-14 ENCOUNTER — Other Ambulatory Visit: Payer: Self-pay

## 2021-06-14 VITALS — BP 138/72 | HR 68 | Temp 97.0°F | Resp 16 | Ht 68.0 in | Wt 275.0 lb

## 2021-06-14 DIAGNOSIS — D122 Benign neoplasm of ascending colon: Secondary | ICD-10-CM

## 2021-06-14 DIAGNOSIS — Z1211 Encounter for screening for malignant neoplasm of colon: Secondary | ICD-10-CM

## 2021-06-14 DIAGNOSIS — R195 Other fecal abnormalities: Secondary | ICD-10-CM

## 2021-06-14 DIAGNOSIS — D123 Benign neoplasm of transverse colon: Secondary | ICD-10-CM

## 2021-06-14 MED ORDER — SODIUM CHLORIDE 0.9 % IV SOLN: 500.0000 mL | Freq: Once | INTRAVENOUS | Status: DC

## 2021-06-14 NOTE — Progress Notes (Signed)
Called to room to assist during endoscopic procedure.  Patient ID and intended procedure confirmed with present staff. Received instructions for my participation in the procedure from the performing physician.  

## 2021-06-14 NOTE — Progress Notes (Signed)
GASTROENTEROLOGY PROCEDURE H&P NOTE   Primary Care Physician: Mosie Lukes, MD    Reason for Procedure:   Positive FIT   Plan:    Colonoscopy  Patient is appropriate for endoscopic procedure(s) in the ambulatory ([place]) setting.  The nature of the procedure, as well as the risks, benefits, and alternatives were carefully and thoroughly reviewed with the patient. Ample time for discussion and questions allowed. The patient understood, was satisfied, and agreed to proceed.     HPI: Renee Larson is a 71 y.o. female who presents for colonoscopy for evaluation of positive FIT .  Patient was most recently seen in the Gastroenterology Clinic on 06/01/21.  No interval change in medical history since that appointment. Please refer to that note for full details regarding GI history and clinical presentation.   Past Medical History:  Diagnosis Date   Allergy    Elevated BP 01/30/2015   History of chicken pox    Hyperglycemia 01/30/2015   Hyperlipidemia 01/30/2015   Obesity 01/14/2015   Sickle cell anemia ([place])     Past Surgical History:  Procedure Laterality Date   BUNIONECTOMY  [place]   WRIST SURGERY  2013   Broken right wrist    Prior to Admission medications   Medication Sig Start Date End Date Taking? Authorizing Provider  amLODipine (NORVASC) 5 MG tablet Take 5 mg by mouth daily. 03/06/21  Yes [provider]  aspirin 81 MG EC tablet Adult Low Dose Aspirin 81 mg tablet,delayed release   1 tablet every day by oral route.   Yes [provider]  hydrochlorothiazide (HYDRODIURIL) 25 MG tablet hydrochlorothiazide 25 mg tablet   1 tablet every day by oral route.   Yes [provider]  loratadine (CLARITIN) 10 MG tablet loratadine 10 mg tablet   1 tablet every day by oral route.   Yes [provider]  Misc Natural Products (OSTEO BI-FLEX/5-LOXIN ADVANCED PO) Osteo  Bi-Flex (5-Loxin)   Yes [provider]  pravastatin (PRAVACHOL) 20 MG tablet  03/01/21  Yes [provider]    Current Outpatient Medications  Medication Sig Dispense Refill   amLODipine (NORVASC) 5 MG tablet Take 5 mg by mouth daily.     aspirin 81 MG EC tablet Adult Low Dose Aspirin 81 mg tablet,delayed release   1 tablet every day by oral route.     hydrochlorothiazide (HYDRODIURIL) 25 MG tablet hydrochlorothiazide 25 mg tablet   1 tablet every day by oral route.     loratadine (CLARITIN) 10 MG tablet loratadine 10 mg tablet   1 tablet every day by oral route.     Misc Natural Products (OSTEO BI-FLEX/5-LOXIN ADVANCED PO) Osteo Bi-Flex (5-Loxin)     pravastatin (PRAVACHOL) 20 MG tablet      Current Facility-Administered Medications  Medication Dose Route Frequency Provider Last Rate Last Admin   0.9 %  sodium chloride infusion  500 mL Intravenous Once Sharyn Creamer, MD        Allergies as of 06/14/2021   (No Known Allergies)    Family History  Problem Relation Age of Onset   Cancer Mother        smoker   Thyroid cancer Mother    Breast cancer Mother    Cancer Father        smoker   Bladder Cancer Father    Prostate cancer Father  Breast cancer Maternal Aunt    Hyperlipidemia Maternal Aunt    Colon polyps Neg Hx    Stomach cancer Neg Hx    Esophageal cancer Neg Hx    Pancreatic cancer Neg Hx     Social History   Socioeconomic History   Marital status: Married    Spouse name: Not on file   Number of children: Not on file   Years of education: 18   Highest education level: Not on file  Occupational History   Occupation: Retired Licensed conveyancer  Tobacco Use   Smoking status: Never   Smokeless tobacco: Never  Vaping Use   Vaping Use: Never used  Substance and Sexual Activity   Alcohol use: No    Alcohol/week: 0.0 standard drinks   Drug use: No   Sexual activity: Yes    Partners: Male    Comment: Husband, no dietary restrictins heart healthy  diet, retired Proofreader  Other Topics Concern   Not on file  Social History Narrative   Not on file   Social Determinants of Health   Financial Resource Strain: Not on file  Food Insecurity: Not on file  Transportation Needs: Not on file  Physical Activity: Not on file  Stress: Not on file  Social Connections: Not on file  Intimate Partner Violence: Not on file    Physical Exam: Vital signs in last 24 hours: BP (!) 179/93    Pulse 88    Temp (!) 97 F (36.1 C) (Temporal)    Resp (!) 28    Ht 5\' 8"  (1.727 m)    Wt 275 lb (124.7 kg)    SpO2 98%    BMI 41.81 kg/m  GEN: NAD EYE: Sclerae anicteric ENT: MMM CV: Non-tachycardic Pulm: No increased WOB GI: Soft NEURO:  Alert & Oriented   Christia Reading, MD [facility]   06/14/2021 9:27 AM

## 2021-06-14 NOTE — Patient Instructions (Signed)
Handout on polyps, diverticulosis, and hemorrhoids given.  YOU HAD AN ENDOSCOPIC PROCEDURE TODAY AT [facility]:   Refer to the procedure report that was given to you for any specific questions about what was found during the examination.  If the procedure report does not answer your questions, please call your gastroenterologist to clarify.  If you requested that your care partner not be given the details of your procedure findings, then the procedure report has been included in a sealed envelope for you to review at your convenience later.  YOU SHOULD EXPECT: Some feelings of bloating in the abdomen. Passage of more gas than usual.  Walking can help get rid of the air that was put into your GI tract during the procedure and reduce the bloating. If you had a lower endoscopy (such as a colonoscopy or flexible sigmoidoscopy) you may notice spotting of blood in your stool or on the toilet paper. If you underwent a bowel prep for your procedure, you may not have a normal bowel movement for a few days.  Please Note:  You might notice some irritation and congestion in your nose or some drainage.  This is from the oxygen used during your procedure.  There is no need for concern and it should clear up in a day or so.  SYMPTOMS TO REPORT IMMEDIATELY:  Following lower endoscopy (colonoscopy or flexible sigmoidoscopy):  Excessive amounts of blood in the stool  Significant tenderness or worsening of abdominal pains  Swelling of the abdomen that is new, acute  Fever of 100F or higher  For urgent or emergent issues, a gastroenterologist can be reached at any hour by calling [phone]. Do not use MyChart messaging for urgent concerns.    DIET:  We do recommend a small meal at first, but then you may proceed to your regular diet.  Drink plenty of fluids but you should avoid alcoholic beverages for 24 hours.  ACTIVITY:  You should plan to take it easy for the rest of today and you should  NOT DRIVE or use heavy machinery until tomorrow (because of the sedation medicines used during the test).    FOLLOW UP: Our staff will call the number listed on your records 48-72 hours following your procedure to check on you and address any questions or concerns that you may have regarding the information given to you following your procedure. If we do not reach you, we will leave a message.  We will attempt to reach you two times.  During this call, we will ask if you have developed any symptoms of COVID 19. If you develop any symptoms (ie: fever, flu-like symptoms, shortness of breath, cough etc.) before then, please call [phone].  If you test positive for Covid 19 in the 2 weeks post procedure, please call and report this information to [place].    If any biopsies were taken you will be contacted by phone or by letter within the next 1-3 weeks.  Please call us at [phone] if you have not heard about the biopsies in 3 weeks.    SIGNATURES/CONFIDENTIALITY: You and/or your care partner have signed paperwork which will be entered into your electronic medical record.  These signatures attest to the fact that that the information above on your After Visit Summary has been reviewed and is understood.  Full responsibility of the confidentiality of this discharge information lies with you and/or your care-partner.

## 2021-06-14 NOTE — Progress Notes (Signed)
To pacu, VSS. Report to [email or web address] 

## 2021-06-14 NOTE — Op Note (Signed)
[facility] Patient Name: Renee Larson Procedure Date: 06/14/2021 9:12 AM MRN: [id number] Endoscopist: Sonny Masters "Renee Larson ,  Age: 71 Referring MD:  Date of Birth: Nov 09, 1950 Gender: Female Account #: [id number] Procedure:                Colonoscopy Indications:              Positive fecal immunochemical test Medicines:                Monitored Anesthesia Care Procedure:                Pre-Anesthesia Assessment:                           - Prior to the procedure, a History and Physical                            was performed, and patient medications and                            allergies were reviewed. The patient's tolerance of                            previous anesthesia was also reviewed. The risks                            and benefits of the procedure and the sedation                            options and risks were discussed with the patient.                            All questions were answered, and informed consent                            was obtained. Prior Anticoagulants: The patient has                            taken no previous anticoagulant or antiplatelet                            agents. ASA Grade Assessment: III - A patient with                            severe systemic disease. After reviewing the risks                            and benefits, the patient was deemed in                            satisfactory condition to undergo the procedure.                           After obtaining informed consent, the colonoscope  was passed under direct vision. Throughout the                            procedure, the patient's blood pressure, pulse, and                            oxygen saturations were monitored continuously. The                            CF [id number] #[id number] was introduced through the anus                            and advanced to the the terminal ileum. The                            colonoscopy was  performed without difficulty. The                            patient tolerated the procedure well. The quality                            of the bowel preparation was adequate. The terminal                            ileum, ileocecal valve, appendiceal orifice, and                            rectum were photographed. Scope In: 9:29:39 AM Scope Out: 9:48:27 AM Scope Withdrawal Time: 0 hours 12 minutes 13 seconds  Total Procedure Duration: 0 hours 18 minutes 48 seconds  Findings:                 The terminal ileum appeared normal.                           Four sessile polyps were found in the transverse                            colon and ascending colon. The polyps were 2 to 10                            mm in size. These polyps were removed with a cold                            snare. Resection was complete, but the polyp tissue                            was only partially retrieved.                           A few small-mouthed diverticula were found in the                            sigmoid colon.  Non-bleeding internal hemorrhoids were found during                            retroflexion. Complications:            No immediate complications. Estimated Blood Loss:     Estimated blood loss was minimal. Impression:               - The examined portion of the ileum was normal.                           - Four 2 to 10 mm polyps in the transverse colon                            and in the ascending colon, removed with a cold                            snare. Complete resection. Partial retrieval.                           - Diverticulosis in the sigmoid colon.                           - Non-bleeding internal hemorrhoids. Recommendation:           - Discharge patient to home (with escort).                           - Await pathology results.                           - The findings and recommendations were discussed                            with the  patient. Sonny Masters "Renee Larson,  06/14/2021 9:54:07 AM

## 2021-06-14 NOTE — Progress Notes (Signed)
Vitals-CW  History reviewed. 

## 2021-06-16 ENCOUNTER — Encounter: Payer: Self-pay | Admitting: Internal Medicine

## 2021-06-16 ENCOUNTER — Telehealth: Payer: Self-pay

## 2021-06-16 NOTE — Telephone Encounter (Signed)
°  Follow up Call-  Call back number 06/14/2021  Post procedure Call Back phone  # [phone]  Permission to leave phone message Yes  Some recent data might be hidden     Patient questions:  Do you have a fever, pain , or abdominal swelling? No. Pain Score  0 *  Have you tolerated food without any problems? Yes.    Have you been able to return to your normal activities? Yes.    Do you have any questions about your discharge instructions: Diet   No. Medications  No. Follow up visit  No.  Do you have questions or concerns about your Care? No.  Actions: * If pain score is 4 or above: No action needed, pain <4.

## 2021-08-13 IMAGING — MG MM DIGITAL SCREENING BILAT W/ TOMO AND CAD
8 series · 8 of 24 positions shown · non-contrast
Comparison: Previous exam(s).

ACR Breast Density Category a: The breast tissue is almost entirely
fatty.

CLINICAL DATA: Screening.

EXAM:
DIGITAL SCREENING BILATERAL MAMMOGRAM WITH TOMOSYNTHESIS AND CAD
TECHNIQUE: Bilateral screening digital craniocaudal and mediolateral oblique
mammograms were obtained. Bilateral screening digital breast
tomosynthesis was performed. The images were evaluated with
computer-aided detection.

[R CC synth-2D]
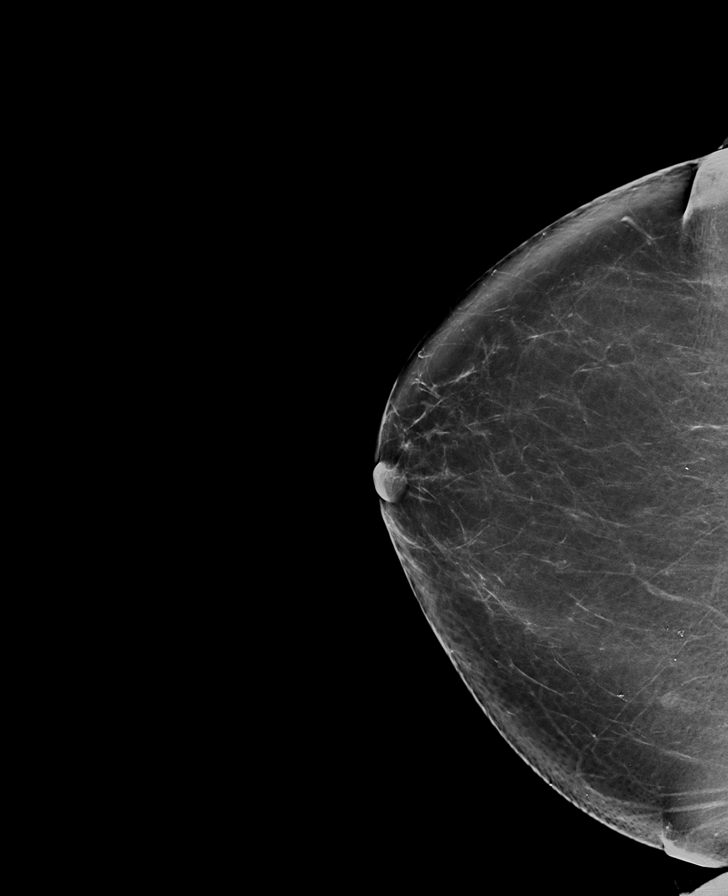

[L MLO synth-2D]
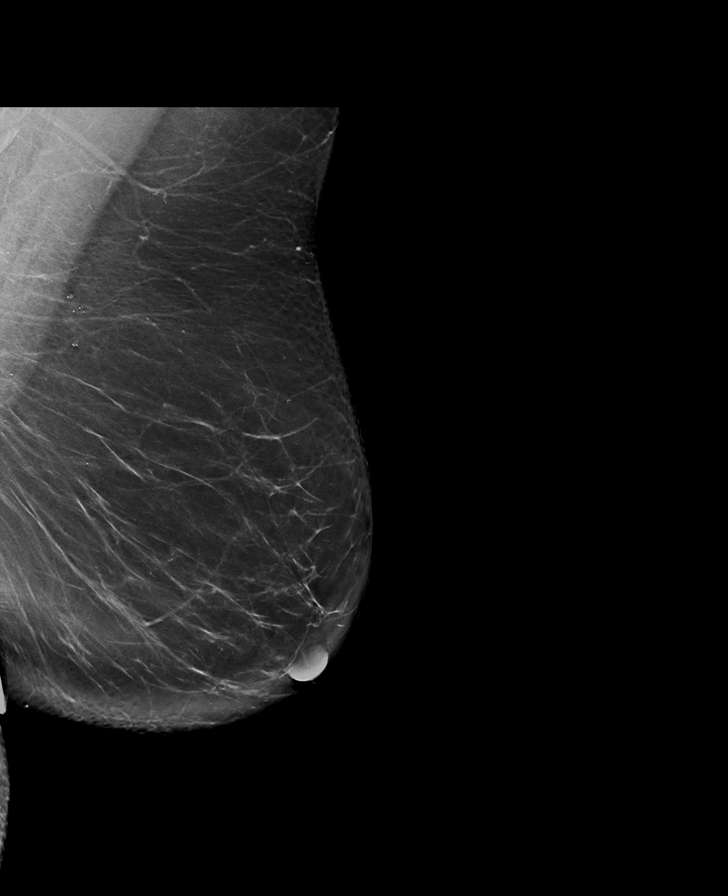

[L CC synth-2D]
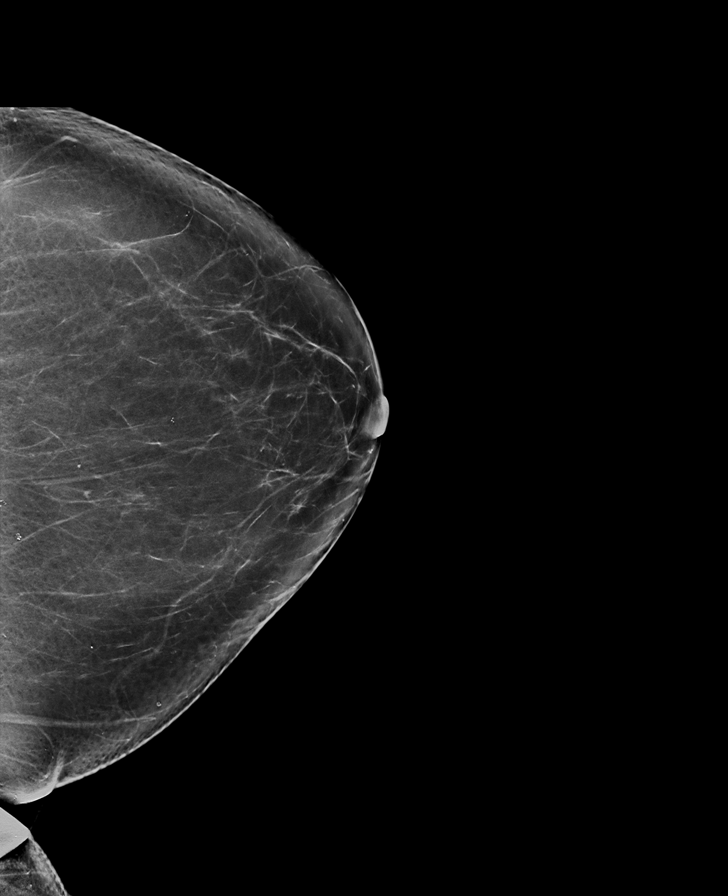

[R MLO synth-2D]
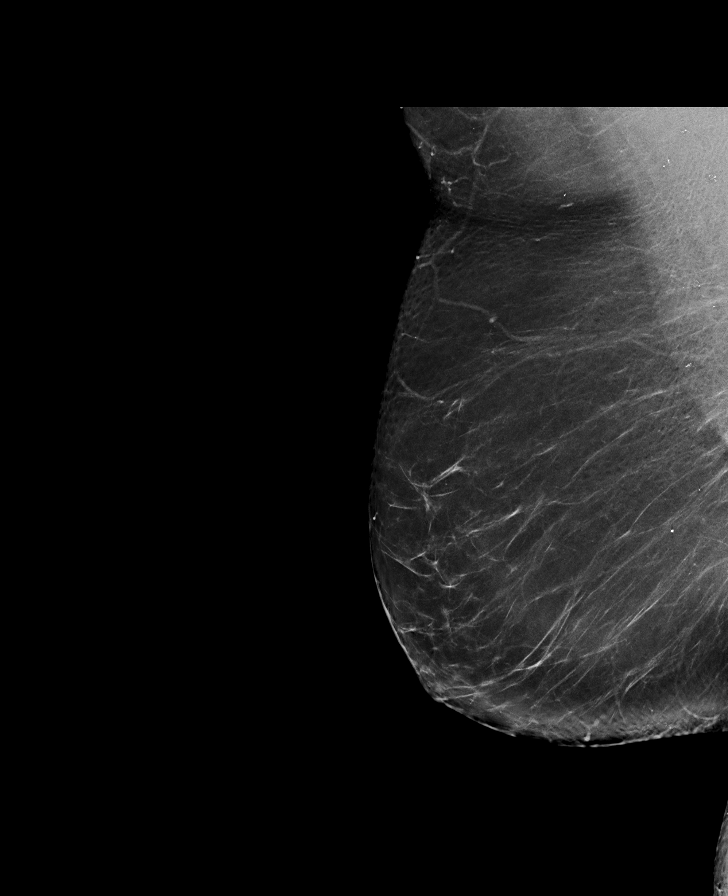

[R MLO tomo · tomo slice 47/93.0]
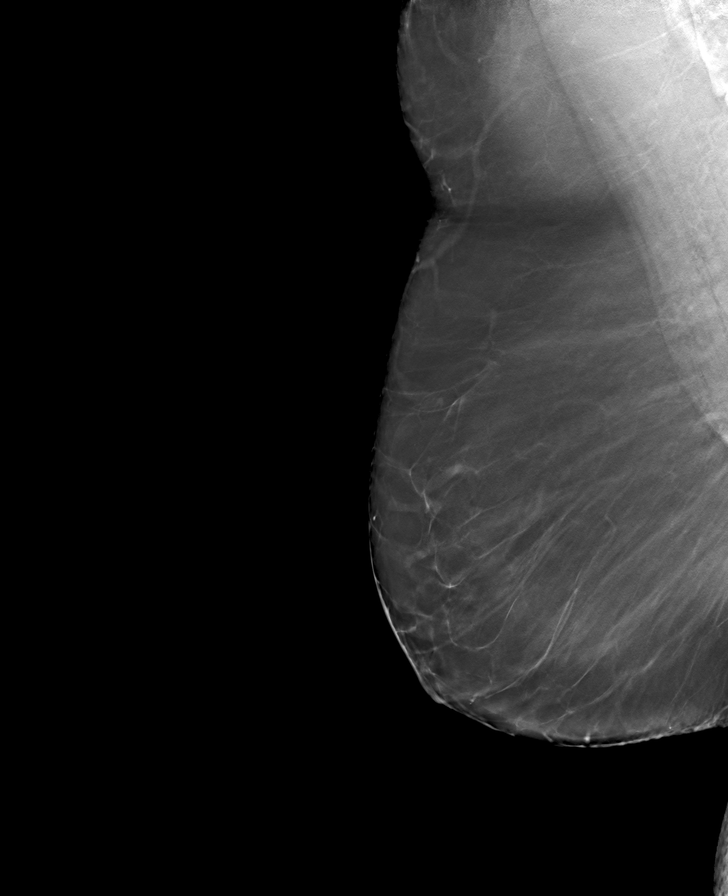

[R CC tomo · tomo slice 42/83.0]
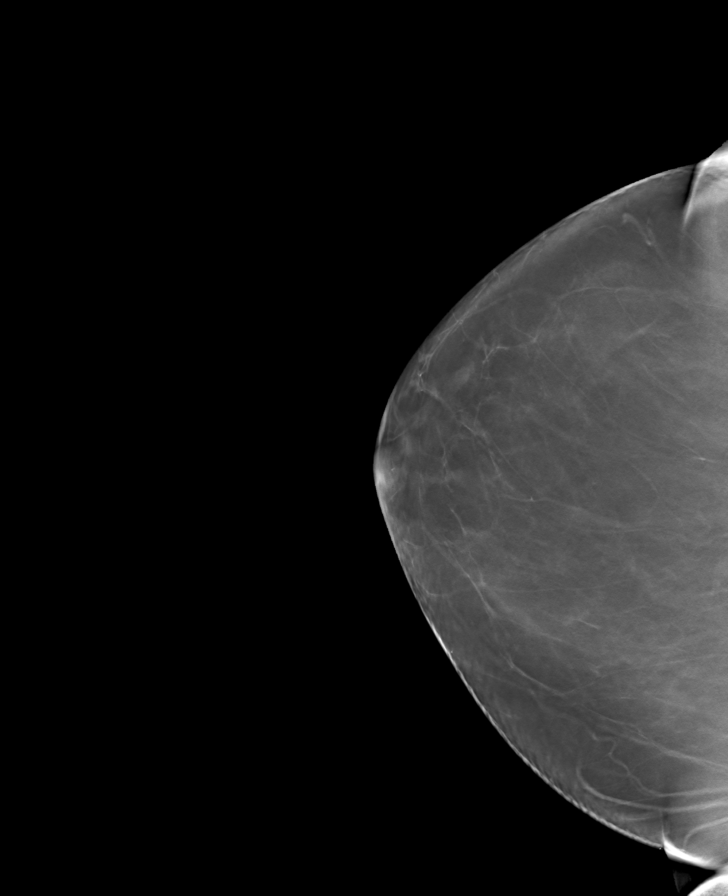

[L MLO tomo · tomo slice 49/97.0]
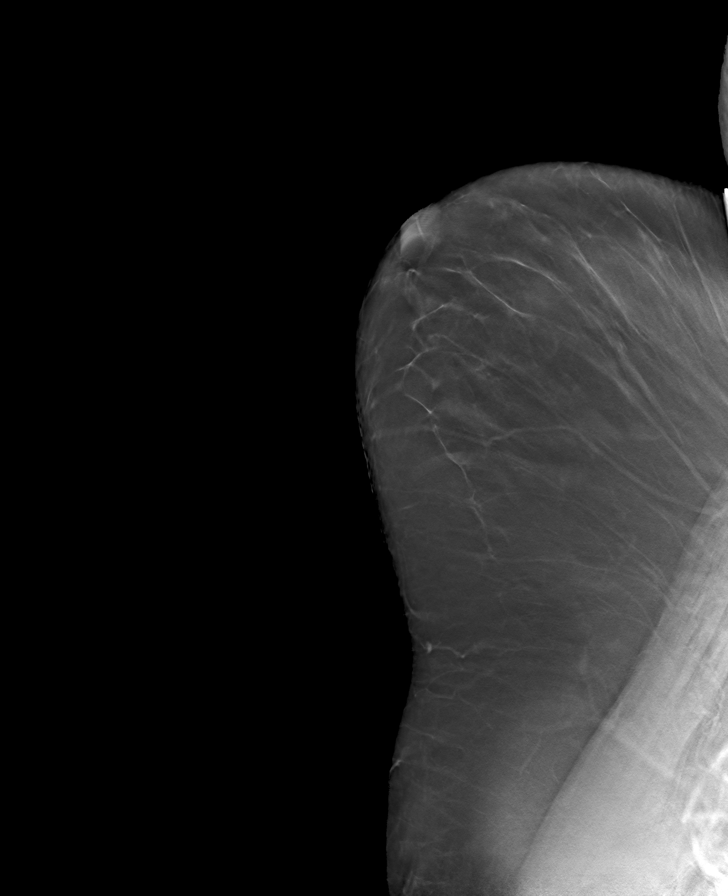

[L CC tomo · tomo slice 40/79.0]
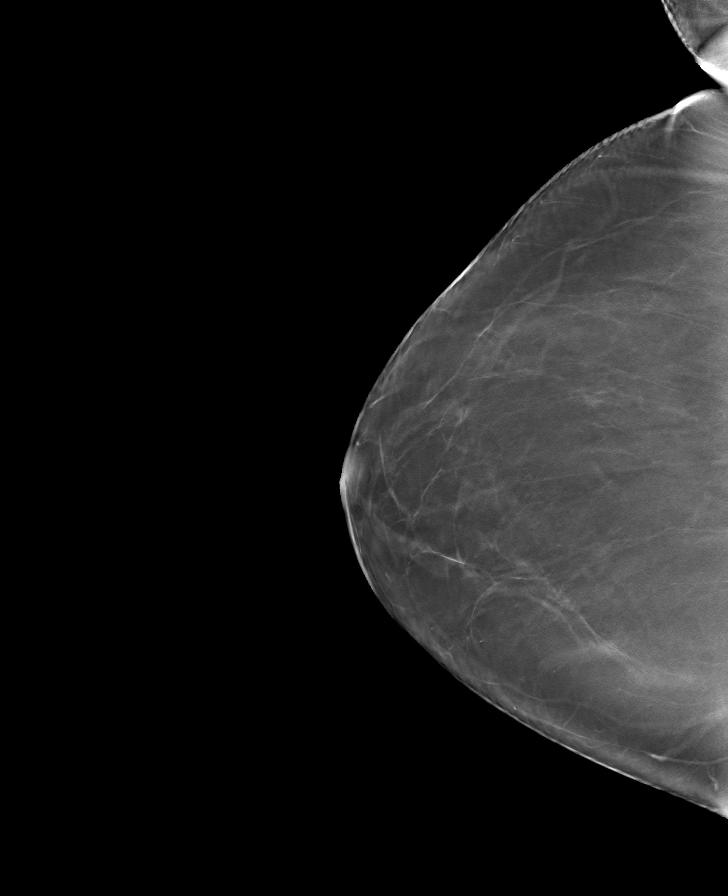

[8 of 24 positions shown; findings below may reference images not displayed]

FINDINGS: There are no findings suspicious for malignancy.
IMPRESSION: No mammographic evidence of malignancy. A result letter of this
screening mammogram will be mailed directly to the patient.

RECOMMENDATION:
Screening mammogram in one year. (Code:0E-3-N98)

BI-RADS CATEGORY  1: Negative.

## 2021-11-03 ENCOUNTER — Other Ambulatory Visit (HOSPITAL_BASED_OUTPATIENT_CLINIC_OR_DEPARTMENT_OTHER): Payer: Self-pay | Admitting: Family Medicine

## 2021-11-03 DIAGNOSIS — Z1231 Encounter for screening mammogram for malignant neoplasm of breast: Secondary | ICD-10-CM

## 2021-12-25 ENCOUNTER — Ambulatory Visit (HOSPITAL_BASED_OUTPATIENT_CLINIC_OR_DEPARTMENT_OTHER)
Admission: RE | Admit: 2021-12-25 | Discharge: 2021-12-25 | Disposition: A | Discharge status: Home / Self Care | Payer: Medicare PPO | Source: Ambulatory Visit | Attending: Family Medicine | Admitting: Family Medicine

## 2021-12-25 ENCOUNTER — Encounter (HOSPITAL_BASED_OUTPATIENT_CLINIC_OR_DEPARTMENT_OTHER): Payer: Self-pay

## 2021-12-25 DIAGNOSIS — Z1231 Encounter for screening mammogram for malignant neoplasm of breast: Secondary | ICD-10-CM | POA: Diagnosis present

## 2022-04-16 ENCOUNTER — Other Ambulatory Visit (HOSPITAL_BASED_OUTPATIENT_CLINIC_OR_DEPARTMENT_OTHER): Payer: Self-pay | Admitting: Family Medicine

## 2022-04-16 DIAGNOSIS — Z13820 Encounter for screening for osteoporosis: Secondary | ICD-10-CM

## 2022-04-16 DIAGNOSIS — Z780 Asymptomatic menopausal state: Secondary | ICD-10-CM

## 2022-04-17 ENCOUNTER — Ambulatory Visit (HOSPITAL_BASED_OUTPATIENT_CLINIC_OR_DEPARTMENT_OTHER)
Admission: RE | Admit: 2022-04-17 | Discharge: 2022-04-17 | Disposition: A | Discharge status: Home / Self Care | Payer: Medicare PPO | Source: Ambulatory Visit | Attending: Family Medicine | Admitting: Family Medicine

## 2022-04-17 DIAGNOSIS — Z780 Asymptomatic menopausal state: Secondary | ICD-10-CM | POA: Insufficient documentation

## 2022-04-17 DIAGNOSIS — Z13820 Encounter for screening for osteoporosis: Secondary | ICD-10-CM | POA: Diagnosis not present

## 2022-08-21 ENCOUNTER — Encounter: Payer: Self-pay | Admitting: Podiatry

## 2022-08-21 ENCOUNTER — Ambulatory Visit: Payer: Medicare PPO | Admitting: Podiatry

## 2022-08-21 DIAGNOSIS — M79676 Pain in unspecified toe(s): Secondary | ICD-10-CM | POA: Diagnosis not present

## 2022-08-21 DIAGNOSIS — D2371 Other benign neoplasm of skin of right lower limb, including hip: Secondary | ICD-10-CM

## 2022-08-21 DIAGNOSIS — D2372 Other benign neoplasm of skin of left lower limb, including hip: Secondary | ICD-10-CM

## 2022-08-21 DIAGNOSIS — B351 Tinea unguium: Secondary | ICD-10-CM

## 2022-08-21 DIAGNOSIS — E119 Type 2 diabetes mellitus without complications: Secondary | ICD-10-CM

## 2022-08-21 NOTE — Progress Notes (Signed)
  Subjective:  Patient ID: Renee Larson, female    DOB: 1950/12/29,  MRN: [id number] HPI Chief Complaint  Patient presents with   Diabetes    Foot exam - recently diagnosed in Dec with diabetes, last A1c is unknown, thick 1st right, callused areas, would like trimmed   New Patient (Initial Visit)    72 y.o. female presents with the above complaint.   ROS: Denies fever chills nausea vomit muscle aches pains calf pain back pain chest pain shortness of breath.  Past Medical History:  Diagnosis Date   Allergy    Elevated BP 01/30/2015   History of chicken pox    Hyperglycemia 01/30/2015   Hyperlipidemia 01/30/2015   Obesity 01/14/2015   Sickle cell anemia    Past Surgical History:  Procedure Laterality Date   BUNIONECTOMY  1995   CESAREAN SECTION  1981   CESAREAN SECTION  1988   TONSILLECTOMY  1960   WRIST SURGERY  2013   Broken right wrist    Current Outpatient Medications:    ACCU-CHEK GUIDE test strip, , Disp: , Rfl:    Accu-Chek Softclix Lancets lancets, , Disp: , Rfl:    metFORMIN (GLUCOPHAGE) 500 MG tablet, Take 500 mg by mouth 2 (two) times daily., Disp: , Rfl:    aspirin 81 MG EC tablet, Adult Low Dose Aspirin 81 mg tablet,delayed release   1 tablet every day by oral route., Disp: , Rfl:    hydrochlorothiazide (HYDRODIURIL) 25 MG tablet, hydrochlorothiazide 25 mg tablet   1 tablet every day by oral route., Disp: , Rfl:    loratadine (CLARITIN) 10 MG tablet, loratadine 10 mg tablet   1 tablet every day by oral route., Disp: , Rfl:    Misc Natural Products (OSTEO BI-FLEX/5-LOXIN ADVANCED PO), Osteo Bi-Flex (5-Loxin), Disp: , Rfl:    pravastatin (PRAVACHOL) 20 MG tablet, , Disp: , Rfl:   No Known Allergies Review of Systems Objective:  There were no vitals filed for this visit.  General: Well developed, nourished, in no acute distress, alert and oriented x3   Dermatological: Skin is warm, dry and supple bilateral. Nails x 10 are thick yellow dystrophic clinically  mycotic and benign skin lesions.  Remaining integument appears unremarkable at this time. There are no open sores, no preulcerative lesions, no rash or signs of infection present.  Vascular: Dorsalis Pedis artery and Posterior Tibial artery pedal pulses are 2/4 bilateral with immedate capillary fill time. Pedal hair growth present. No varicosities and lower extremity edema present bilateral.   Neruologic: Grossly intact via light touch bilateral. Vibratory intact via tuning fork bilateral. Protective threshold with Semmes Wienstein monofilament intact to all pedal sites bilateral. Patellar and Achilles deep tendon reflexes 2+ bilateral. No Babinski or clonus noted bilateral.  No open lesions or wounds.  Musculoskeletal: No gross boney pedal deformities bilateral. No pain, crepitus, or limitation noted with foot and ankle range of motion bilateral. Muscular strength 5/5 in all groups tested bilateral.  Gait: Unassisted, Nonantalgic.    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  Benign skin lesions as well.  Diabetes without complications.    Plan: Discussed etiology pathology conservative surgical therapies at this point debrided all benign skin lesions debrided toenails 1 through 5 bilateral discussed appropriate skin care and shoe use.  Follow-up with me in 6 months and did discuss routine care for nails.     Renee Larson T. [place], [place]

## 2022-12-18 ENCOUNTER — Other Ambulatory Visit (HOSPITAL_BASED_OUTPATIENT_CLINIC_OR_DEPARTMENT_OTHER): Payer: Self-pay | Admitting: Family Medicine

## 2022-12-18 DIAGNOSIS — Z1231 Encounter for screening mammogram for malignant neoplasm of breast: Secondary | ICD-10-CM

## 2023-01-01 ENCOUNTER — Ambulatory Visit (HOSPITAL_BASED_OUTPATIENT_CLINIC_OR_DEPARTMENT_OTHER)
Admission: RE | Admit: 2023-01-01 | Discharge: 2023-01-01 | Disposition: A | Discharge status: Home / Self Care | Payer: Medicare PPO | Source: Ambulatory Visit | Attending: Family Medicine | Admitting: Family Medicine

## 2023-01-01 ENCOUNTER — Encounter (HOSPITAL_BASED_OUTPATIENT_CLINIC_OR_DEPARTMENT_OTHER): Payer: Self-pay

## 2023-01-01 DIAGNOSIS — Z1231 Encounter for screening mammogram for malignant neoplasm of breast: Secondary | ICD-10-CM | POA: Diagnosis present

## 2023-05-15 ENCOUNTER — Inpatient Hospital Stay: Payer: Medicare PPO | Attending: Hematology & Oncology

## 2023-05-15 ENCOUNTER — Inpatient Hospital Stay: Payer: Medicare PPO | Admitting: Hematology & Oncology

## 2023-05-15 ENCOUNTER — Encounter: Payer: Self-pay | Admitting: Hematology & Oncology

## 2023-05-15 VITALS — BP 128/71 | HR 72 | Temp 98.2°F | Resp 20 | Ht 68.0 in | Wt 233.0 lb

## 2023-05-15 DIAGNOSIS — I150 Renovascular hypertension: Secondary | ICD-10-CM

## 2023-05-15 DIAGNOSIS — Z79899 Other long term (current) drug therapy: Secondary | ICD-10-CM | POA: Insufficient documentation

## 2023-05-15 DIAGNOSIS — D751 Secondary polycythemia: Secondary | ICD-10-CM | POA: Diagnosis not present

## 2023-05-15 LAB — CBC WITH DIFFERENTIAL (CANCER CENTER ONLY)
Abs Immature Granulocytes: 0.04 10*3/uL (ref 0.00–0.07)
Basophils Absolute: 0 10*3/uL (ref 0.0–0.1)
Basophils Relative: 0 %
Eosinophils Absolute: 0.1 10*3/uL (ref 0.0–0.5)
Eosinophils Relative: 1 %
HCT: 47.2 % — ABNORMAL HIGH (ref 36.0–46.0)
Hemoglobin: 15.7 g/dL — ABNORMAL HIGH (ref 12.0–15.0)
Immature Granulocytes: 0 %
Lymphocytes Relative: 26 %
Lymphs Abs: 2.6 10*3/uL (ref 0.7–4.0)
MCH: 29.5 pg (ref 26.0–34.0)
MCHC: 33.3 g/dL (ref 30.0–36.0)
MCV: 88.6 fL (ref 80.0–100.0)
Monocytes Absolute: 0.7 10*3/uL (ref 0.1–1.0)
Monocytes Relative: 7 %
Neutro Abs: 6.5 10*3/uL (ref 1.7–7.7)
Neutrophils Relative %: 66 %
Platelet Count: 352 10*3/uL (ref 150–400)
RBC: 5.33 MIL/uL — ABNORMAL HIGH (ref 3.87–5.11)
RDW: 12.7 % (ref 11.5–15.5)
WBC Count: 10 10*3/uL (ref 4.0–10.5)
nRBC: 0 % (ref 0.0–0.2)

## 2023-05-15 LAB — CMP (CANCER CENTER ONLY)
ALT: 17 U/L (ref 0–44)
AST: 14 U/L — ABNORMAL LOW (ref 15–41)
Albumin: 4.7 g/dL (ref 3.5–5.0)
Alkaline Phosphatase: 88 U/L (ref 38–126)
Anion gap: 13 (ref 5–15)
BUN: 13 mg/dL (ref 8–23)
CO2: 29 mmol/L (ref 22–32)
Calcium: 10.2 mg/dL (ref 8.9–10.3)
Chloride: 100 mmol/L (ref 98–111)
Creatinine: 0.75 mg/dL (ref 0.44–1.00)
GFR, Estimated: >60 mL/min (ref >60)
Glucose, Bld: 108 mg/dL — ABNORMAL HIGH (ref 70–99)
Potassium: 3.9 mmol/L (ref 3.5–5.1)
Sodium: 142 mmol/L (ref 135–145)
Total Bilirubin: 0.6 mg/dL (ref 0.0–1.2)
Total Protein: 7.7 g/dL (ref 6.5–8.1)

## 2023-05-15 LAB — SAVE SMEAR(SSMR), FOR PROVIDER SLIDE REVIEW

## 2023-05-15 LAB — LACTATE DEHYDROGENASE: LDH: 144 U/L (ref 98–192)

## 2023-05-15 LAB — VITAMIN B12: Vitamin B-12: 229 pg/mL (ref 180–914)

## 2023-05-15 NOTE — Progress Notes (Signed)
 Referral MD  Reason for Referral: Erythrocytosis  Chief Complaint  Patient presents with   Follow-up   New Patient (Initial Visit)    Something in my lab work is slightly elevated.  : My red cells are high.  HPI: Renee Larson is a very nice 73 year old white female.  She is originally from [place] .  She lives in assisted living now.  She has 3 grandchildren.  Her husband unfortunate, passed away from lung cancer and he never smoked.  She is followed by Dr. Gretta.  As always, Dr. Gretta is incredibly thorough.  Dr. 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  She had lab work that was done in November.  This showed a white cell count of 9.6.  Hemoglobin 16.1.  Hematocrit 49.7.  Platelet count was 420,000.  MCV was 90.  She had a normal white cell differential.  Going through the medical record, back in 2016, her CBC showed a white cell count of 9.  Hemoglobin 15.6.  Platelet count was 308,000.  MCV was 87.  She has never smoked.  She has very rare alcohol use.  She has had no bleeding.  There is been no change in bowel or bladder habits.  She is up-to-date with her mammogram and colonoscopy.  There is no history of blood problems in her family.  She does take baby aspirin.  She does not have sleep apnea.  She is not a vegetarian.  She has had no headache.  There is been no blurred vision.  She has had no pruritus after taking a shower.  She does have dry skin.  She has never had COVID.  Overall, I would say that her performance status is probably ECOG 1.    Past Medical History:  Diagnosis Date   Allergy    Elevated BP 01/30/2015   History of chicken pox    Hyperglycemia 01/30/2015   Hyperlipidemia 01/30/2015   Obesity 01/14/2015   Sickle cell anemia (HCC)   :   Past Surgical History:  Procedure Laterality Date   BUNIONECTOMY  1995   CESAREAN SECTION  1981   CESAREAN SECTION  1988   TONSILLECTOMY  1960   WRIST SURGERY  2013   Broken right wrist   :   Current Outpatient Medications:    ACCU-CHEK GUIDE test strip, , Disp: , Rfl:    Accu-Chek Softclix Lancets lancets, , Disp: , Rfl:    amLODipine  (NORVASC ) 10 MG tablet, Take 10 mg by mouth daily., Disp: , Rfl:    aspirin 81 MG EC tablet, Adult Low Dose Aspirin 81 mg tablet,delayed release   1 tablet every day by oral route., Disp: , Rfl:    hydrochlorothiazide  (HYDRODIURIL ) 25 MG tablet, hydrochlorothiazide  25 mg tablet   1 tablet every day by oral route., Disp: , Rfl:    loratadine (CLARITIN) 10 MG tablet, loratadine 10 mg tablet   1 tablet every day by oral route., Disp: , Rfl:    metFORMIN  (GLUCOPHAGE ) 500 MG tablet, Take 500 mg by mouth 2 (two) times daily., Disp: , Rfl:    Misc Natural Products (OSTEO BI-FLEX/5-LOXIN ADVANCED PO), daily., Disp: , Rfl:    OZEMPIC, 1 MG/DOSE, 4 MG/3ML SOPN, 1 mg once a week., Disp: , Rfl:    pravastatin (PRAVACHOL) 40 MG tablet, Take 40 mg by mouth daily., Disp: , Rfl: :  :  No Known Allergies:   Family History  Problem Relation Age of Onset   Cancer Mother  smoker   Thyroid  cancer Mother    Breast cancer Mother    Cancer Father        smoker   Bladder Cancer Father    Prostate cancer Father    Breast cancer Maternal Aunt    Hyperlipidemia Maternal Aunt    Colon polyps Neg Hx    Stomach cancer Neg Hx    Esophageal cancer Neg Hx    Pancreatic cancer Neg Hx   :   Social History   Socioeconomic History   Marital status: Widowed    Spouse name: Not on file   Number of children: Not on file   Years of education: 80   Highest education level: Not on file  Occupational History   Occupation: Retired Comptroller  Tobacco Use   Smoking status: Never   Smokeless tobacco: Never  Vaping Use   Vaping status: Never Used  Substance and Sexual Activity   Alcohol use: No    Alcohol/week: 0.0 standard drinks of alcohol   Drug use: No   Sexual activity: Not Currently    Partners: Male    Comment: Husband, no dietary restrictins  heart healthy diet, retired advice worker  Other Topics Concern   Not on file  Social History Narrative   Not on file   Social Drivers of Health   Financial Resource Strain: Low Risk  (05/15/2023)   Overall Financial Resource Strain (CARDIA)    Difficulty of Paying Living Expenses: Not hard at all  Food Insecurity: No Food Insecurity (05/15/2023)   Hunger Vital Sign    Worried About Running Out of Food in the Last Year: Never true    Ran Out of Food in the Last Year: Never true  Transportation Needs: No Transportation Needs (05/15/2023)   PRAPARE - Administrator, Civil Service (Medical): No    Lack of Transportation (Non-Medical): No  Physical Activity: Insufficiently Active (05/15/2023)   Exercise Vital Sign    Days of Exercise per Week: 7 days    Minutes of Exercise per Session: 20 min  Stress: No Stress Concern Present (05/15/2023)   [employer] of Occupational Health - Occupational Stress Questionnaire    Feeling of Stress : Not at all  Social Connections: Moderately Integrated (05/15/2023)   Social Connection and Isolation Panel [NHANES]    Frequency of Communication with Friends and Family: More than three times a week    Frequency of Social Gatherings with Friends and Family: More than three times a week    Attends Religious Services: More than 4 times per year    Active Member of [employer] or Organizations: Yes    Attends Banker Meetings: 1 to 4 times per year    Marital Status: Widowed  Intimate Partner Violence: Not At Risk (05/15/2023)   Humiliation, Afraid, Rape, and Kick questionnaire    Fear of Current or Ex-Partner: No    Emotionally Abused: No    Physically Abused: No    Sexually Abused: No  :  Review of Systems  Constitutional: Negative.   HENT: Negative.    Eyes: Negative.   Respiratory: Negative.    Cardiovascular: Negative.   Gastrointestinal: Negative.   Genitourinary: Negative.   Musculoskeletal: Negative.   Skin: Negative.    Neurological: Negative.   Endo/Heme/Allergies: Negative.   Psychiatric/Behavioral: Negative.       Exam: Vital signs show a temperature of 98.2.  Pulse 72.  Blood pressure 128/71.  Weight is 233 pounds.  @IPVITALS @ Physical Exam  Vitals reviewed.  HENT:     Head: Normocephalic and atraumatic.  Eyes:     Pupils: Pupils are equal, round, and reactive to light.  Cardiovascular:     Rate and Rhythm: Normal rate and regular rhythm.     Heart sounds: Normal heart sounds.  Pulmonary:     Effort: Pulmonary effort is normal.     Breath sounds: Normal breath sounds.  Abdominal:     General: Bowel sounds are normal.     Palpations: Abdomen is soft.  Musculoskeletal:        General: No tenderness or deformity. Normal range of motion.     Cervical back: Normal range of motion.  Lymphadenopathy:     Cervical: No cervical adenopathy.  Skin:    General: Skin is warm and dry.     Findings: No erythema or rash.  Neurological:     Mental Status: She is alert and oriented to person, place, and time.  Psychiatric:        Behavior: Behavior normal.        Thought Content: Thought content normal.        Judgment: Judgment normal.     Recent Labs    05/15/23 1351  WBC 10.0  HGB 15.7*  HCT 47.2*  PLT 352    Recent Labs    05/15/23 1351  NA 142  K 3.9  CL 100  CO2 29  GLUCOSE 108*  BUN 13  CREATININE 0.75  CALCIUM 10.2    Blood smear review: Normochromic and normocytic population of red blood cells.  There is no nucleated red blood cells.  I see no teardrop cells.  There is no rouleaux formation.  I see no nucleated red blood cells.  She has no target cells.  There is no schistocytes or spherocytes.  White blood cells.  Normal in morphology and maturation.  There is no immature myeloid or lymphoid forms.  There is no hypersegmented polys.  I see no immature myeloid or lymphoid forms.  Platelets are adequate in number and size.  Pathology: None    Assessment and Plan: Ms.  Larson is a very charming 73 year old white female.  She has erythrocytosis.  It is hard to say if this is polycythemia.  Given the fact that her hemoglobin really has not changed in a matter of 8-9 years, I would be surprised if she did have an underlying myeloproliferative disorder.  Regardless, we will send off the JAK2 mutation.  I will also send off a ultrasound of the spleen to see if there is any splenomegaly.  Again, we will see what her erythropoietin  level looks like.  Her blood smear certainly was not remarkable.  Her physical exam was not remarkable.  I am glad that she is taking baby aspirin.  I think this is a good idea.  We will plan to get her back once we had the results back from the JAK2 and the ultrasound.  Answered all of her questions.  Again she was very charming and I had a delightful time talking with her.

## 2023-05-17 LAB — ERYTHROPOIETIN: Erythropoietin: 5.8 m[IU]/mL (ref 2.6–18.5)

## 2023-05-24 ENCOUNTER — Ambulatory Visit (HOSPITAL_BASED_OUTPATIENT_CLINIC_OR_DEPARTMENT_OTHER)
Admission: RE | Admit: 2023-05-24 | Discharge: 2023-05-24 | Disposition: A | Discharge status: Home / Self Care | Payer: Medicare PPO | Source: Ambulatory Visit | Attending: Hematology & Oncology | Admitting: Hematology & Oncology

## 2023-05-24 DIAGNOSIS — I150 Renovascular hypertension: Secondary | ICD-10-CM | POA: Diagnosis present

## 2023-05-27 LAB — INTELLIGEN MYELOID

## 2023-07-16 LAB — OPHTHALMOLOGY REPORT-SCANNED

## 2023-10-23 ENCOUNTER — Ambulatory Visit: Admitting: Internal Medicine

## 2023-10-23 ENCOUNTER — Encounter: Admitting: Internal Medicine

## 2023-10-23 ENCOUNTER — Encounter: Payer: Self-pay | Admitting: Internal Medicine

## 2023-10-23 VITALS — BP 118/74 | HR 93 | Temp 97.3°F | Resp 18 | Ht 68.5 in | Wt 222.8 lb

## 2023-10-23 DIAGNOSIS — K5901 Slow transit constipation: Secondary | ICD-10-CM

## 2023-10-23 DIAGNOSIS — E1169 Type 2 diabetes mellitus with other specified complication: Secondary | ICD-10-CM

## 2023-10-23 DIAGNOSIS — I10 Essential (primary) hypertension: Secondary | ICD-10-CM | POA: Diagnosis not present

## 2023-10-23 DIAGNOSIS — E119 Type 2 diabetes mellitus without complications: Secondary | ICD-10-CM | POA: Insufficient documentation

## 2023-10-23 DIAGNOSIS — R718 Other abnormality of red blood cells: Secondary | ICD-10-CM | POA: Diagnosis not present

## 2023-10-23 DIAGNOSIS — E782 Mixed hyperlipidemia: Secondary | ICD-10-CM | POA: Diagnosis not present

## 2023-10-23 NOTE — Patient Instructions (Signed)
 Please go to the new clinic at [facility] to have your lab work completed next week. Tuesday 24, 2025 at 7:00am.

## 2023-10-23 NOTE — Progress Notes (Signed)
 [facility]   Clinic  Provider:   Code Status:  Goals of Care:     10/23/2023    8:17 AM  Advanced Directives  Does Patient Have a Medical Advance Directive? Yes  Type of Advance Directive Healthcare Power of Attorney  Does patient want to make changes to medical advance directive? No - Patient declined  Copy of Healthcare Power of Attorney in Chart? No - copy requested     Chief Complaint  Patient presents with   Establish Care     Patient is wanting to start seeing Dr. Crissie Dome    HPI: Patient is a 73 y.o. female seen today for medical management of chronic diseases.    Came to establish care  Patient lives in [facility] for Past 4 years  She has a history of diabetes type 2 follows with ophthalmologist.  Has lost almost 30 pounds on Ozempic. She brought her CBG today and were in good limits  Hypertension, HLD, history of positive Hemoccult colonoscopy showed polyps precancerous recommended colonoscopy in 3 years  Elevated hemoglobin seen by Dr. Maria Shiner ultrasound was negative for spleen enlargement JAK2 to negative  Discussed the use of AI scribe software for clinical note transcription with the patient, who gave verbal consent to proceed.  History of Present Illness   Renee Larson is a 73 year old female with diabetes who presents for a routine follow-up.  Her diabetes is well-controlled with metformin and Ozempic, with significant weight loss from 296 pounds in July 2020 to 222.8 pounds currently. She manages slower digestion, a side effect of Ozempic, with high fiber foods, water, and MiraLAX as needed. Hypertension is managed with amlodipine and hydrochlorothiazide, and she is on a statin for hyperlipidemia. She experiences frequent urination in the morning and nocturia, with no daytime urinary urgency or incontinence. Bowel movements are regular, aided by MiraLAX as needed. She engages in physical activities, including drumming  classes, strength training, balance classes, and walking, and has recently increased her exercise routine.      Husband Died of Lung cancer Daughter and son in town [address] [place]  Past Medical History:  Diagnosis Date   Allergy    Elevated BP 01/30/2015   History of chicken pox    Hyperglycemia 01/30/2015   Hyperlipidemia 01/30/2015   Obesity 01/14/2015    Past Surgical History:  Procedure Laterality Date   BUNIONECTOMY  1995   CESAREAN SECTION  1981   CESAREAN SECTION  1988   TONSILLECTOMY  1960   WRIST SURGERY  2013   Broken right wrist    No Known Allergies  Outpatient Encounter Medications as of 10/23/2023  Medication Sig   ACCU-CHEK GUIDE test strip    Accu-Chek Softclix Lancets lancets    amLODipine (NORVASC) 10 MG tablet Take 10 mg by mouth daily.   aspirin 81 MG EC tablet Adult Low Dose Aspirin 81 mg tablet,delayed release   1 tablet every day by oral route.   hydrochlorothiazide (HYDRODIURIL) 25 MG tablet hydrochlorothiazide 25 mg tablet   1 tablet every day by oral route.   loratadine (CLARITIN) 10 MG tablet loratadine 10 mg tablet   1 tablet every day by oral route.   metFORMIN (GLUCOPHAGE) 500 MG tablet Take 500 mg by mouth 2 (two) times daily.   OZEMPIC, 1 MG/DOSE, 4 MG/3ML SOPN 2 mg once a week.   pravastatin (PRAVACHOL) 40 MG tablet Take 40 mg by mouth daily.  Misc Natural Products (OSTEO BI-FLEX/5-LOXIN ADVANCED PO) daily. (Patient not taking: Reported on 10/23/2023)   No facility-administered encounter medications on file as of 10/23/2023.    Review of Systems:  Review of Systems  Constitutional:  Negative for activity change and appetite change.  HENT: Negative.    Respiratory:  Negative for cough and shortness of breath.   Cardiovascular:  Negative for leg swelling.  Gastrointestinal:  Negative for constipation.  Genitourinary:  Positive for frequency.  Musculoskeletal:  Negative for arthralgias, gait problem and myalgias.  Skin: Negative.    Neurological:  Negative for dizziness and weakness.  Psychiatric/Behavioral:  Negative for confusion, dysphoric mood and sleep disturbance.     Health Maintenance  Topic Date Due   Medicare Annual Wellness (AWV)  Never done   Hepatitis C Screening  Never done   COVID-19 Vaccine (5 - Moderna risk 2024-25 season) 12/06/2023 (Originally 08/21/2023)   INFLUENZA VACCINE  12/06/2023   Colonoscopy  06/14/2024   MAMMOGRAM  12/31/2024   DTaP/Tdap/Td (2 - Td or Tdap) 01/13/2025   Pneumococcal Vaccine: 50+ Years  Completed   DEXA SCAN  Completed   Zoster Vaccines- Shingrix  Completed   HPV VACCINES  Aged Out   Meningococcal B Vaccine  Aged Out    Physical Exam: Vitals:   10/23/23 0807  BP: 118/74  Pulse: 93  Resp: 18  Temp: (!) 97.3 F (36.3 C)  SpO2: 97%  Weight: 222 lb 12.8 oz (101.1 kg)  Height: 5' 8.5 (1.74 m)   Body mass index is 33.38 kg/m. Physical Exam Vitals reviewed.  Constitutional:      Appearance: Normal appearance.  HENT:     Head: Normocephalic.     Right Ear: Tympanic membrane normal.     Left Ear: Tympanic membrane normal.     Nose: Nose normal.     Mouth/Throat:     Mouth: Mucous membranes are moist.     Pharynx: Oropharynx is clear.   Eyes:     Pupils: Pupils are equal, round, and reactive to light.    Cardiovascular:     Rate and Rhythm: Normal rate and regular rhythm.     Pulses: Normal pulses.     Heart sounds: Normal heart sounds. No murmur heard. Pulmonary:     Effort: Pulmonary effort is normal.     Breath sounds: Normal breath sounds.  Abdominal:     General: Abdomen is flat. Bowel sounds are normal.     Palpations: Abdomen is soft.   Musculoskeletal:        General: No swelling.     Cervical back: Neck supple.   Skin:    General: Skin is warm.   Neurological:     General: No focal deficit present.     Mental Status: She is alert and oriented to person, place, and time.   Psychiatric:        Mood and Affect: Mood normal.         Thought Content: Thought content normal.     Labs reviewed: Basic Metabolic Panel: Recent Labs    05/15/23 1351  NA 142  K 3.9  CL 100  CO2 29  GLUCOSE 108*  BUN 13  CREATININE 0.75  CALCIUM 10.2   Liver Function Tests: Recent Labs    05/15/23 1351  AST 14*  ALT 17  ALKPHOS 88  BILITOT 0.6  PROT 7.7  ALBUMIN 4.7   No results for input(s): LIPASE, AMYLASE in the last 8760 hours. No results for  input(s): AMMONIA in the last 8760 hours. CBC: Recent Labs    05/15/23 1351  WBC 10.0  NEUTROABS 6.5  HGB 15.7*  HCT 47.2*  MCV 88.6  PLT 352   Lipid Panel: No results for input(s): CHOL, HDL, LDLCALC, TRIG, CHOLHDL, LDLDIRECT in the last 8760 hours. No results found for: HGBA1C  Procedures since last visit: No results found.  Assessment/Plan 1. Type 2 diabetes mellitus with other specified complication, without long-term current use of insulin (HCC) (Primary) Repeat Labs Metformin Ozempic  2. Essential hypertension Norvasc and HCTZ  3. Mixed hyperlipidemia Statin  4. Elevated hematocrit Work up negative no Follow up needed  5. Slow transit constipation Miralax  Up to date on Vaccintaion and DEXA and Mammogram   Labs/tests ordered:  * No order type specified * Next appt:  Visit date not found   Dr Henreitta Locus derm

## 2023-10-29 LAB — CBC WITH DIFFERENTIAL/PLATELET
Absolute Lymphocytes: 2810 {cells}/uL (ref 850–3900)
Absolute Monocytes: 713 {cells}/uL (ref 200–950)
Basophils Absolute: 61 {cells}/uL (ref 0–200)
Basophils Relative: 0.7 %
Eosinophils Absolute: 209 {cells}/uL (ref 15–500)
Eosinophils Relative: 2.4 %
HCT: 46.7 % — ABNORMAL HIGH (ref 35.0–45.0)
Hemoglobin: 15.4 g/dL (ref 11.7–15.5)
MCH: 29.3 pg (ref 27.0–33.0)
MCHC: 33 g/dL (ref 32.0–36.0)
MCV: 88.8 fL (ref 80.0–100.0)
MPV: 9.8 fL (ref 7.5–12.5)
Monocytes Relative: 8.2 %
Neutro Abs: 4907 {cells}/uL (ref 1500–7800)
Neutrophils Relative %: 56.4 %
Platelets: 380 10*3/uL (ref 140–400)
RBC: 5.26 10*6/uL — ABNORMAL HIGH (ref 3.80–5.10)
RDW: 13.1 % (ref 11.0–15.0)
Total Lymphocyte: 32.3 %
WBC: 8.7 10*3/uL (ref 3.8–10.8)

## 2023-10-29 LAB — TSH: TSH: 4.33 m[IU]/L (ref 0.40–4.50)

## 2023-10-29 LAB — COMPLETE METABOLIC PANEL WITHOUT GFR
AG Ratio: 1.9 (calc) (ref 1.0–2.5)
ALT: 19 U/L (ref 6–29)
AST: 15 U/L (ref 10–35)
Albumin: 4.5 g/dL (ref 3.6–5.1)
Alkaline phosphatase (APISO): 83 U/L (ref 37–153)
BUN: 15 mg/dL (ref 7–25)
CO2: 28 mmol/L (ref 20–32)
Calcium: 9.9 mg/dL (ref 8.6–10.4)
Chloride: 100 mmol/L (ref 98–110)
Creat: 0.74 mg/dL (ref 0.60–1.00)
Globulin: 2.4 g/dL (ref 1.9–3.7)
Glucose, Bld: 103 mg/dL — ABNORMAL HIGH (ref 65–99)
Potassium: 3.5 mmol/L (ref 3.5–5.3)
Sodium: 138 mmol/L (ref 135–146)
Total Bilirubin: 0.5 mg/dL (ref 0.2–1.2)
Total Protein: 6.9 g/dL (ref 6.1–8.1)

## 2023-10-29 LAB — LIPID PANEL
Cholesterol: 136 mg/dL (ref <200)
HDL: 57 mg/dL (ref >=50)
LDL Cholesterol (Calc): 60 mg/dL
Non-HDL Cholesterol (Calc): 79 mg/dL (ref <130)
Total CHOL/HDL Ratio: 2.4 (calc) (ref <5.0)
Triglycerides: 100 mg/dL (ref <150)

## 2023-10-29 LAB — HEMOGLOBIN A1C
Hgb A1c MFr Bld: 5.5 % (ref <5.7)
Mean Plasma Glucose: 111 mg/dL
eAG (mmol/L): 6.2 mmol/L

## 2023-11-27 ENCOUNTER — Other Ambulatory Visit: Payer: Self-pay

## 2023-11-27 MED ORDER — AMLODIPINE BESYLATE 10 MG PO TABS: 10.0000 mg | ORAL_TABLET | Freq: Every day | ORAL | 3 refills | Status: DC

## 2023-11-27 MED ORDER — ACCU-CHEK GUIDE TEST VI STRP: ORAL_STRIP | 12 refills | Status: DC

## 2023-11-27 MED ORDER — ACCU-CHEK SOFTCLIX LANCETS MISC: 3 refills | Status: DC

## 2023-12-31 ENCOUNTER — Other Ambulatory Visit (HOSPITAL_BASED_OUTPATIENT_CLINIC_OR_DEPARTMENT_OTHER): Payer: Self-pay | Admitting: Internal Medicine

## 2023-12-31 DIAGNOSIS — Z1231 Encounter for screening mammogram for malignant neoplasm of breast: Secondary | ICD-10-CM

## 2024-01-09 ENCOUNTER — Encounter (HOSPITAL_BASED_OUTPATIENT_CLINIC_OR_DEPARTMENT_OTHER): Payer: Self-pay

## 2024-01-09 ENCOUNTER — Ambulatory Visit (HOSPITAL_BASED_OUTPATIENT_CLINIC_OR_DEPARTMENT_OTHER)
Admission: RE | Admit: 2024-01-09 | Discharge: 2024-01-09 | Disposition: A | Discharge status: Home / Self Care | Source: Ambulatory Visit | Attending: Internal Medicine | Admitting: Internal Medicine

## 2024-01-09 DIAGNOSIS — Z1231 Encounter for screening mammogram for malignant neoplasm of breast: Secondary | ICD-10-CM | POA: Diagnosis present

## 2024-02-04 NOTE — Patient Instructions (Signed)
 1) Our records indicate you are due for an annual wellness visit, stop at check out to schedule (video or in-person).    2) Please visit your local pharmacy to receive your  flu and covid vaccine, if you have not already received.    3) Labs will be done on October 9,2025 at [facility] at 7:45 am

## 2024-02-05 ENCOUNTER — Non-Acute Institutional Stay: Admitting: Internal Medicine

## 2024-02-05 ENCOUNTER — Encounter: Payer: Self-pay | Admitting: Internal Medicine

## 2024-02-05 VITALS — BP 128/62 | HR 72 | Temp 97.3°F | Resp 18 | Ht 68.5 in | Wt 217.4 lb

## 2024-02-05 DIAGNOSIS — E782 Mixed hyperlipidemia: Secondary | ICD-10-CM

## 2024-02-05 DIAGNOSIS — E1169 Type 2 diabetes mellitus with other specified complication: Secondary | ICD-10-CM

## 2024-02-05 DIAGNOSIS — R718 Other abnormality of red blood cells: Secondary | ICD-10-CM | POA: Diagnosis not present

## 2024-02-05 DIAGNOSIS — I10 Essential (primary) hypertension: Secondary | ICD-10-CM

## 2024-02-05 DIAGNOSIS — E2839 Other primary ovarian failure: Secondary | ICD-10-CM | POA: Diagnosis not present

## 2024-02-05 NOTE — Progress Notes (Signed)
 Location:   [facility]  Clinic ([id number])  Provider:   Code Status:  Goals of Care:     10/23/2023    8:17 AM  Advanced Directives  Does Patient Have a Medical Advance Directive? Yes  Type of Advance Directive Healthcare Power of Attorney  Does patient want to make changes to medical advance directive? No - Patient declined  Copy of Healthcare Power of Attorney in Chart? No - copy requested     Chief Complaint  Patient presents with   Medical Management of Chronic Issues     3 month follow up with labs, needs to discuss medicare annual wellness visit, eye and foot exam, diabetic kidney evaluation-urine ACR and eGFR measurement, and hepatitis C screening, flu and covid vaccine     HPI: Patient is a 73 y.o. female seen today for medical management of chronic diseases.    Patient lives in [facility]  for Past 4 years   Renee Larson has a history of diabetes type 2 follows with ophthalmologist.  Has lost almost 30 pounds on Ozempic.  Hypertension, HLD, history of positive Hemoccult colonoscopy showed polyps precancerous recommended colonoscopy in 3 years   Elevated hemoglobin seen by Dr. Timmy ultrasound was negative for spleen enlargement JAK2 to negative    Discussed the use of AI scribe software for clinical note transcription with the patient, who gave verbal consent to proceed.  History of Present Illness   Renee Larson is a 73 year old female with diabetes and hypertension who presents for a routine follow-up visit.  Renee Larson monitors her blood sugar twice daily and her blood pressure daily, using self-created forms. Renee Larson is on Ozempic without issues or symptoms of hypoglycemia. Her last A1c was 5.5%. Her weight has been decreasing, with the last recorded weight being 222.8 pounds. Renee Larson engages in regular physical activity, including walking, strength classes, balance classes, tai chi, and drumming sessions. No dizziness or swelling, and her shoes do  not feel tight at the end of the day. Renee Larson is up to date with her eye exams, having had a diabetic eye exam in January 2025. No issues with hearing or vision. Her bowel movements are regular.       Past Medical History:  Diagnosis Date   Allergy    Elevated BP 01/30/2015   History of chicken pox    Hyperglycemia 01/30/2015   Hyperlipidemia 01/30/2015   Obesity 01/14/2015    Past Surgical History:  Procedure Laterality Date   BUNIONECTOMY  1995   CESAREAN SECTION  1981   CESAREAN SECTION  1988   TONSILLECTOMY  1960   WRIST SURGERY  2013   Broken right wrist    No Known Allergies  Outpatient Encounter Medications as of 02/05/2024  Medication Sig   Accu-Chek Softclix Lancets lancets Use as instructed   amLODipine  (NORVASC ) 10 MG tablet Take 1 tablet (10 mg total) by mouth daily.   aspirin 81 MG EC tablet Adult Low Dose Aspirin 81 mg tablet,delayed release   1 tablet every day by oral route.   glucose blood (ACCU-CHEK GUIDE TEST) test strip Use as instructed   hydrochlorothiazide (HYDRODIURIL) 25 MG tablet hydrochlorothiazide 25 mg tablet   1 tablet every day by oral route.   loratadine (CLARITIN) 10 MG tablet loratadine 10 mg tablet   1 tablet every day by oral route.   metFORMIN (GLUCOPHAGE) 500 MG tablet Take 500 mg by mouth 2 (two) times daily.   OZEMPIC,  1 MG/DOSE, 4 MG/3ML SOPN 2 mg once a week.   polyethylene glycol powder (GLYCOLAX/MIRALAX) 17 GM/SCOOP powder Take 17 g by mouth daily as needed.   pravastatin (PRAVACHOL) 40 MG tablet Take 40 mg by mouth daily.   No facility-administered encounter medications on file as of 02/05/2024.    Review of Systems:  Review of Systems  Constitutional:  Negative for activity change and appetite change.  HENT: Negative.    Respiratory:  Negative for cough and shortness of breath.   Cardiovascular:  Negative for leg swelling.  Gastrointestinal:  Negative for constipation.  Genitourinary: Negative.   Musculoskeletal:  Negative  for arthralgias, gait problem and myalgias.  Skin: Negative.   Neurological:  Negative for dizziness and weakness.  Psychiatric/Behavioral:  Negative for confusion, dysphoric mood and sleep disturbance.     Health Maintenance  Topic Date Due   Medicare Annual Wellness (AWV)  Never done   FOOT EXAM  Never done   OPHTHALMOLOGY EXAM  Never done   Diabetic kidney evaluation - Urine ACR  Never done   Influenza Vaccine  02/20/2024 (Originally 12/06/2023)   COVID-19 Vaccine (5 - Moderna risk 2024-25 season) 02/20/2024 (Originally 01/06/2024)   HEMOGLOBIN A1C  04/29/2024   Colonoscopy  06/14/2024   Diabetic kidney evaluation - eGFR measurement  10/28/2024   DTaP/Tdap/Td (2 - Td or Tdap) 01/13/2025   Mammogram  01/08/2026   Pneumococcal Vaccine: 50+ Years  Completed   DEXA SCAN  Completed   Hepatitis C Screening  Completed   Zoster Vaccines- Shingrix  Completed   HPV VACCINES  Aged Out   Meningococcal B Vaccine  Aged Out    Physical Exam: Vitals:   02/05/24 0848  BP: 128/62  Pulse: 72  Resp: 18  Temp: (!) 97.3 F (36.3 C)  SpO2: 98%  Weight: 217 lb 6.4 oz (98.6 kg)  Height: 5' 8.5 (1.74 m)   Body mass index is 32.57 kg/m. Physical Exam Vitals reviewed.  Constitutional:      Appearance: Normal appearance.  HENT:     Head: Normocephalic.     Right Ear: Tympanic membrane normal.     Left Ear: Tympanic membrane normal.     Ears:     Comments: Some wax in Left ear    Nose: Nose normal.     Mouth/Throat:     Mouth: Mucous membranes are moist.     Pharynx: Oropharynx is clear.  Eyes:     Pupils: Pupils are equal, round, and reactive to light.  Cardiovascular:     Rate and Rhythm: Normal rate and regular rhythm.     Pulses: Normal pulses.     Heart sounds: Normal heart sounds. No murmur heard. Pulmonary:     Effort: Pulmonary effort is normal.     Breath sounds: Normal breath sounds.  Abdominal:     General: Abdomen is flat. Bowel sounds are normal.     Palpations:  Abdomen is soft.  Musculoskeletal:        General: No swelling.     Cervical back: Neck supple.  Skin:    General: Skin is warm.  Neurological:     General: No focal deficit present.     Mental Status: Renee Larson is alert and oriented to person, place, and time.  Psychiatric:        Mood and Affect: Mood normal.        Thought Content: Thought content normal.     Labs reviewed: Basic Metabolic Panel: Recent Labs  05/15/23 1351 10/29/23 0645  NA 142 138  K 3.9 3.5  CL 100 100  CO2 29 28  GLUCOSE 108* 103*  BUN 13 15  CREATININE 0.75 0.74  CALCIUM 10.2 9.9  TSH  --  4.33   Liver Function Tests: Recent Labs    05/15/23 1351 10/29/23 0645  AST 14* 15  ALT 17 19  ALKPHOS 88  --   BILITOT 0.6 0.5  PROT 7.7 6.9  ALBUMIN 4.7  --    No results for input(s): LIPASE, AMYLASE in the last 8760 hours. No results for input(s): AMMONIA in the last 8760 hours. CBC: Recent Labs    05/15/23 1351 10/29/23 0645  WBC 10.0 8.7  NEUTROABS 6.5 4,907  HGB 15.7* 15.4  HCT 47.2* 46.7*  MCV 88.6 88.8  PLT 352 380   Lipid Panel: Recent Labs    10/29/23 0645  CHOL 136  HDL 57  LDLCALC 60  TRIG 100  CHOLHDL 2.4   Lab Results  Component Value Date   HGBA1C 5.5 10/29/2023    Procedures since last visit: MM 3D SCREENING MAMMOGRAM BILATERAL BREAST Result Date: 01/14/2024 CLINICAL DATA:  Screening. EXAM: DIGITAL SCREENING BILATERAL MAMMOGRAM WITH TOMOSYNTHESIS AND CAD TECHNIQUE: Bilateral screening digital craniocaudal and mediolateral oblique mammograms were obtained. Bilateral screening digital breast tomosynthesis was performed. The images were evaluated with computer-aided detection. COMPARISON:  Previous exam(s). ACR Breast Density Category a: The breasts are almost entirely fatty. FINDINGS: There are no findings suspicious for malignancy. IMPRESSION: No mammographic evidence of malignancy. A result letter of this screening mammogram will be mailed directly to the patient.  RECOMMENDATION: Screening mammogram in one year. (Code:SM-B-01Y) BI-RADS CATEGORY  1: Negative. Electronically Signed   By: Alm Parkins M.D.   On: 01/14/2024 12:39    Assessment/Plan  1. Type 2 diabetes mellitus with other specified complication, without long-term current use of insulin (HCC)  CBGS are good Metformin and Ozempic  2. Elevated hematocrit Work up before enegative  - CBC with Differential/Platelet  3. Estrogen deficiency (Primary)  - DG Bone Density  4. Essential hypertension BP runs little on Low side No Dizziness Norvasc ,HCTZ  5. Mixed hyperlipidemia On statin  Labs/tests ordered:   Next appt:  Visit date not found

## 2024-02-12 ENCOUNTER — Encounter: Payer: Self-pay | Admitting: Nurse Practitioner

## 2024-02-13 ENCOUNTER — Non-Acute Institutional Stay: Admitting: Nurse Practitioner

## 2024-02-13 ENCOUNTER — Encounter: Payer: Self-pay | Admitting: Nurse Practitioner

## 2024-02-13 VITALS — BP 118/72 | HR 82 | Temp 98.6°F | Ht 68.5 in | Wt 225.0 lb

## 2024-02-13 DIAGNOSIS — E1169 Type 2 diabetes mellitus with other specified complication: Secondary | ICD-10-CM | POA: Diagnosis not present

## 2024-02-13 DIAGNOSIS — Z0000 Encounter for general adult medical examination without abnormal findings: Secondary | ICD-10-CM

## 2024-02-13 DIAGNOSIS — R718 Other abnormality of red blood cells: Secondary | ICD-10-CM | POA: Diagnosis not present

## 2024-02-13 NOTE — Patient Instructions (Signed)
 1.) Lab work on Monday 02/17/24 @ 7:45 am

## 2024-02-13 NOTE — Progress Notes (Unsigned)
 Subjective:   Renee Larson is a 73 y.o. female who presents for Medicare Annual (Subsequent) preventive examination clinic [facility]  Visit Complete: In person  Patient Medicare AWV questionnaire was completed by the patient on 02/13/24; I have confirmed that all information answered by patient is correct and no changes since this date.  Cardiac Risk Factors include: advanced age (>41men, >62 women);diabetes mellitus;dyslipidemia;hypertension;obesity (BMI >30kg/m2)     Objective:    Today's Vitals   02/13/24 1134 02/13/24 1611  BP: 118/72   Pulse: 82   Temp: 98.6 F (37 C)   SpO2: 98%   Weight: 225 lb (102.1 kg)   Height: 5' 8.5 (1.74 m)   PainSc:  0-No pain   Body mass index is 33.71 kg/m.     02/13/2024   11:35 AM 02/12/2024   11:50 AM 10/23/2023    8:17 AM 05/15/2023    2:10 PM  Advanced Directives  Does Patient Have a Medical Advance Directive? Yes Yes Yes Yes  Type of Sales promotion account executive of [employer] Power of [employer] Power of Commerce;Living will  Does patient want to make changes to medical advance directive? No - Patient declined No - Patient declined No - Patient declined   Copy of Healthcare Power of Attorney in Chart? Yes - validated most recent copy scanned in chart (See row information) Yes - validated most recent copy scanned in chart (See row information) No - copy requested     Current Medications (verified) Outpatient Encounter Medications as of 02/13/2024  Medication Sig   Accu-Chek Softclix Lancets lancets Use as instructed   amLODipine  (NORVASC ) 10 MG tablet Take 1 tablet (10 mg total) by mouth daily.   aspirin 81 MG EC tablet Adult Low Dose Aspirin 81 mg tablet,delayed release   1 tablet every day by oral route.   glucose blood (ACCU-CHEK GUIDE TEST) test strip Use as instructed   hydrochlorothiazide (HYDRODIURIL) 25 MG tablet hydrochlorothiazide 25 mg tablet   1 tablet every day by oral  route.   loratadine (CLARITIN) 10 MG tablet loratadine 10 mg tablet   1 tablet every day by oral route.   metFORMIN (GLUCOPHAGE) 500 MG tablet Take 500 mg by mouth 2 (two) times daily.   OZEMPIC, 1 MG/DOSE, 4 MG/3ML SOPN 2 mg once a week.   polyethylene glycol powder (GLYCOLAX/MIRALAX) 17 GM/SCOOP powder Take 17 g by mouth daily as needed.   pravastatin (PRAVACHOL) 40 MG tablet Take 40 mg by mouth daily.   No facility-administered encounter medications on file as of 02/13/2024.    Allergies (verified) Patient has no known allergies.   History: Past Medical History:  Diagnosis Date   Allergy    Elevated BP 01/30/2015   History of chicken pox    Hyperglycemia 01/30/2015   Hyperlipidemia 01/30/2015   Obesity 01/14/2015   Past Surgical History:  Procedure Laterality Date   BUNIONECTOMY  1995   CESAREAN SECTION  1981   CESAREAN SECTION  1988   TONSILLECTOMY  1960   WRIST SURGERY  2013   Broken right wrist   Family History  Problem Relation Age of Onset   Cancer Mother        smoker   Thyroid  cancer Mother    Breast cancer Mother    Cancer Father        smoker   Bladder Cancer Father    Prostate cancer Father    Breast cancer Maternal Aunt    Hyperlipidemia Maternal  Aunt    Colon polyps Neg Hx    Stomach cancer Neg Hx    Esophageal cancer Neg Hx    Pancreatic cancer Neg Hx    Social History   Socioeconomic History   Marital status: Widowed    Spouse name: Not on file   Number of children: Not on file   Years of education: 71   Highest education level: Not on file  Occupational History   Occupation: Retired Comptroller  Tobacco Use   Smoking status: Never   Smokeless tobacco: Never  Vaping Use   Vaping status: Never Used  Substance and Sexual Activity   Alcohol use: No    Alcohol/week: 0.0 standard drinks of alcohol   Drug use: No   Sexual activity: Not Currently    Partners: Male    Comment: Husband, no dietary restrictins heart healthy diet, retired  Advice worker  Other Topics Concern   Not on file  Social History Narrative   Not on file   Social Drivers of Health   Financial Resource Strain: Low Risk  (05/15/2023)   Overall Financial Resource Strain (CARDIA)    Difficulty of Paying Living Expenses: Not hard at all  Food Insecurity: No Food Insecurity (05/15/2023)   Hunger Vital Sign    Worried About Running Out of Food in the Last Year: Never true    Ran Out of Food in the Last Year: Never true  Transportation Needs: No Transportation Needs (05/15/2023)   PRAPARE - Administrator, Civil Service (Medical): No    Lack of Transportation (Non-Medical): No  Physical Activity: Insufficiently Active (05/15/2023)   Exercise Vital Sign    Days of Exercise per Week: 7 days    Minutes of Exercise per Session: 20 min  Stress: No Stress Concern Present (05/15/2023)   [employer] of Occupational Health - Occupational Stress Questionnaire    Feeling of Stress : Not at all  Social Connections: Moderately Integrated (05/15/2023)   Social Connection and Isolation Panel    Frequency of Communication with Friends and Family: More than three times a week    Frequency of Social Gatherings with Friends and Family: More than three times a week    Attends Religious Services: More than 4 times per year    Active Member of [employer] or Organizations: Yes    Attends Banker Meetings: 1 to 4 times per year    Marital Status: Widowed    Tobacco Counseling Counseling given: Not Answered   Clinical Intake:  Pre-visit preparation completed: Yes  Pain : No/denies pain Pain Score: 0-No pain     BMI - recorded: 33.71 Nutritional Status: BMI > 30  Obese Nutritional Risks: Other (Comment) (intentional weight loss) Diabetes: Yes CBG done?: Yes CBG resulted in Enter/ Edit results?: Yes (95)  How often do you need to have someone help you when you read instructions, pamphlets, or other written materials from your doctor or  pharmacy?: 1 - Never What is the last grade level you completed in school?: master degree  Interpreter Needed?: No  Information entered by :: Amahia Madonia Lorenda Hark NP   Activities of Daily Living    02/13/2024    4:15 PM  In your present state of health, do you have any difficulty performing the following activities:  Hearing? 0  Vision? 0  Difficulty concentrating or making decisions? 0  Walking or climbing stairs? 0  Dressing or bathing? 0  Doing errands, shopping? 0  Preparing Food and  eating ? N  Using the Toilet? N  In the past six months, have you accidently leaked urine? N  Do you have problems with loss of bowel control? N  Managing your Medications? N  Managing your Finances? N  Housekeeping or managing your Housekeeping? N    Patient Care Team: Charlanne Fredia CROME, MD as PCP - General (Internal Medicine) Delice Race, MD (Inactive) as Consulting Physician (Family Medicine) [facility])  Indicate any recent Medical Services you may have received from other than [facility] providers in the past year (date may be approximate).     Assessment:   This is a routine wellness examination for [place].  Hearing/Vision screen Hearing Screening - Comments:: No hearing issues  Vision Screening - Comments:: Last eye exam less than 12 months ago with Dr.Hutto    Goals Addressed             This Visit's Progress    Weight Loss Achieved       Evidence-based guidance:  Review medication that may contribute to weight gain, such as corticosteroid, beta-blocker, tricyclic antidepressant, oral antihyperglycemic; advocate for changes when appropriate.  Perform or refer to registered dietitian to perform comprehensive nutrition assessment that includes disordered-eating behaviors, such as binge-eating, emotional or compulsive eating, grazing.  Counsel patient regarding health risks of obesity and that weight loss goal of 5 to 10 percent of initial weight will improve risk.  Recommend  initial weight loss goal of 3 to 5 percent of bodyweight; increase weight-loss goals based on patient success as achieving greater weight loss continues to reduce risk.  Propose a calorie-reduced diet based on the patient's preferences and health status.  Provide ongoing emotional support or cognitive behavioral therapy and dietitian services (individual, group, virtual) over at least 6 months with a minimum of 14 encounters to best facilitate weight loss.  Provide monthly follow-up for 12 months when weight loss goal is met to assist with maintenance of weight loss.  Encourage increased physical activity or exercise based on individual age, risk, and ability up to 200 to 300 minutes per week that includes aerobic and resistance training.  Encourage reduction in sedentary behaviors by replacing them with nonexercise yet active leisure pursuits.  Identify physical barriers, such as change in posture, balance, gait patterns, joint pain, and environmental barriers to activity.  Consider referral to rehabilitation therapy, especially when mobility or function is impaired due to osteoarthritis and obesity.  Consider referral to weight-loss program that has published evidence of safety and efficacy if on-site intensive intervention is unavailable or patient preference.  Prepare patient for use of pharmacologic therapy as an adjunct to lifestyle changes based on body mass index, patient agreement and presence of risk factors or comorbidities.  Evaluate efficacy of pharmacologic therapy (weight loss) and tolerance to medication periodically.  Engage in shared decision-making regarding referral to bariatric surgeon for consultation and evaluation when weight-loss goal has not been accomplished by behavioral therapy with or without pharmacologic therapy.   Notes:        Depression Screen    02/13/2024    3:54 PM 02/05/2024    9:19 AM 10/23/2023    8:23 AM 05/15/2023    2:21 PM  PHQ 2/9 Scores  PHQ - 2  Score 0 0 0 0    Fall Risk    02/13/2024    3:54 PM 02/05/2024    9:19 AM 10/23/2023    8:23 AM  Fall Risk   Falls in the past year? 0  0 0  Number falls in past yr: 0 0 0  Injury with Fall? 0 0 0  Risk for fall due to : No Fall Risks No Fall Risks   Follow up  Falls evaluation completed Falls evaluation completed    MEDICARE RISK AT HOME:    TIMED UP AND GO:  Was the test performed?  No    Cognitive Function:        02/13/2024    3:55 PM  6CIT Screen  What Year? 0 points  What month? 0 points  What time? 0 points  Count back from 20 0 points  Months in reverse 0 points  Repeat phrase 0 points  Total Score 0 points    Immunizations Immunization History  Administered Date(s) Administered   Fluad Quad(high Dose 65+) 12/19/2019, 01/16/2021   INFLUENZA, HIGH DOSE SEASONAL PF 02/04/2018, 01/09/2019, 01/18/2022   Influenza,inj,Quad PF,6+ Mos 01/14/2015   Moderna Covid-19 Fall Seasonal Vaccine 60yrs & older 03/08/2022   Moderna Sars-Covid-2 Vaccination 12/07/2019, 03/15/2020   PNEUMOCOCCAL CONJUGATE-20 01/18/2022   Pfizer(Comirnaty)Fall Seasonal Vaccine 12 years and older 02/20/2023   Tdap 01/14/2015   Zoster Recombinant(Shingrix) 12/19/2019, 02/19/2020   Zoster, Live 01/14/2015    TDAP status: Up to date  Flu Vaccine status: Up to date  Pneumococcal vaccine status: Up to date  Covid-19 vaccine status: Completed vaccines  Qualifies for Shingles Vaccine? Yes   Zostavax completed Yes   Shingrix Completed?: Yes  Screening Tests Health Maintenance  Topic Date Due   OPHTHALMOLOGY EXAM  Never done   Diabetic kidney evaluation - Urine ACR  Never done   Colonoscopy  06/14/2024   Influenza Vaccine  02/20/2024 (Originally 12/06/2023)   COVID-19 Vaccine (5 - 2025-26 season) 02/20/2024 (Originally 01/06/2024)   HEMOGLOBIN A1C  04/29/2024   Diabetic kidney evaluation - eGFR measurement  10/28/2024   DTaP/Tdap/Td (2 - Td or Tdap) 01/13/2025   FOOT EXAM  02/04/2025    Medicare Annual Wellness (AWV)  02/12/2025   Mammogram  01/08/2026   Pneumococcal Vaccine: 50+ Years  Completed   DEXA SCAN  Completed   Hepatitis C Screening  Completed   Zoster Vaccines- Shingrix  Completed   Meningococcal B Vaccine  Aged Out    Health Maintenance  Health Maintenance Due  Topic Date Due   OPHTHALMOLOGY EXAM  Never done   Diabetic kidney evaluation - Urine ACR  Never done   Colonoscopy  06/14/2024    Colorectal cancer screening: Referral to GI placed 06/14/2024. Pt aware the office will call re: appt.  Mammogram status: Completed 2025. Repeat every year  Bone Density status: Ordered 2026. Pt provided with contact info and advised to call to schedule appt.  Lung Cancer Screening: (Low Dose CT Chest recommended if Age 32-80 years, 20 pack-year currently smoking OR have quit w/in 15years.) does not qualify.   Lung Cancer Screening Referral: NA  Additional Screening:  Hepatitis C Screening: ordered  Vision Screening: Recommended annual ophthalmology exams for early detection of glaucoma and other disorders of the eye. Is the patient up to date with their annual eye exam?  Yes  Who is the provider or what is the name of the office in which the patient attends annual eye exams? Dr Raelyn If pt is not established with a provider, would they like to be referred to a provider to establish care? No .   Dental Screening: Recommended annual dental exams for proper oral hygiene  Diabetic Foot Exam: done today  Community Resource Referral /  Chronic Care Management: CRR required this visit?  No   CCM required this visit?  No     Plan:     I have personally reviewed and noted the following in the patient's chart:   Medical and social history Use of alcohol, tobacco or illicit drugs  Current medications and supplements including opioid prescriptions. Patient is not currently taking opioid prescriptions. Functional ability and status Nutritional status Physical  activity Advanced directives List of other physicians Hospitalizations, surgeries, and ER visits in previous 12 months Vitals Screenings to include cognitive, depression, and falls Referrals and appointments  In addition, I have reviewed and discussed with patient certain preventive protocols, quality metrics, and best practice recommendations. A written personalized care plan for preventive services as well as general preventive health recommendations were provided to patient.     Hendricks Schwandt X Aamori Mcmasters, NP   02/13/2024   After Visit Summary: (In Person-Printed) AVS printed and given to the patient

## 2024-02-14 ENCOUNTER — Encounter: Payer: Self-pay | Admitting: Nurse Practitioner

## 2024-02-14 ENCOUNTER — Ambulatory Visit (HOSPITAL_COMMUNITY): Payer: Self-pay | Admitting: Internal Medicine

## 2024-02-14 LAB — COMPLETE METABOLIC PANEL WITHOUT GFR
AG Ratio: 1.8 (calc) (ref 1.0–2.5)
ALT: 19 U/L (ref 6–29)
AST: 16 U/L (ref 10–35)
Albumin: 4.4 g/dL (ref 3.6–5.1)
Alkaline phosphatase (APISO): 72 U/L (ref 37–153)
BUN: 18 mg/dL (ref 7–25)
CO2: 27 mmol/L (ref 20–32)
Calcium: 9.5 mg/dL (ref 8.6–10.4)
Chloride: 101 mmol/L (ref 98–110)
Creat: 0.74 mg/dL (ref 0.60–1.00)
Globulin: 2.4 g/dL (ref 1.9–3.7)
Glucose, Bld: 91 mg/dL (ref 65–99)
Potassium: 3.8 mmol/L (ref 3.5–5.3)
Sodium: 139 mmol/L (ref 135–146)
Total Bilirubin: 0.4 mg/dL (ref 0.2–1.2)
Total Protein: 6.8 g/dL (ref 6.1–8.1)

## 2024-02-14 LAB — CBC WITH DIFFERENTIAL/PLATELET
Absolute Lymphocytes: 2499 {cells}/uL (ref 850–3900)
Absolute Monocytes: 646 {cells}/uL (ref 200–950)
Basophils Absolute: 51 {cells}/uL (ref 0–200)
Basophils Relative: 0.6 %
Eosinophils Absolute: 221 {cells}/uL (ref 15–500)
Eosinophils Relative: 2.6 %
HCT: 46.6 % — ABNORMAL HIGH (ref 35.0–45.0)
Hemoglobin: 15.5 g/dL (ref 11.7–15.5)
MCH: 30 pg (ref 27.0–33.0)
MCHC: 33.3 g/dL (ref 32.0–36.0)
MCV: 90.3 fL (ref 80.0–100.0)
MPV: 9.7 fL (ref 7.5–12.5)
Monocytes Relative: 7.6 %
Neutro Abs: 5083 {cells}/uL (ref 1500–7800)
Neutrophils Relative %: 59.8 %
Platelets: 360 Thousand/uL (ref 140–400)
RBC: 5.16 Million/uL — ABNORMAL HIGH (ref 3.80–5.10)
RDW: 13.3 % (ref 11.0–15.0)
Total Lymphocyte: 29.4 %
WBC: 8.5 Thousand/uL (ref 3.8–10.8)

## 2024-02-14 LAB — MICROALBUMIN / CREATININE URINE RATIO
Creatinine, Urine: 27 mg/dL (ref 20–275)
Microalb, Ur: <0.2 mg/dL

## 2024-02-14 LAB — HEMOGLOBIN A1C
Hgb A1c MFr Bld: 5.4 % (ref <5.7)
Mean Plasma Glucose: 108 mg/dL
eAG (mmol/L): 6 mmol/L

## 2024-03-11 ENCOUNTER — Other Ambulatory Visit: Payer: Self-pay

## 2024-03-11 DIAGNOSIS — E1169 Type 2 diabetes mellitus with other specified complication: Secondary | ICD-10-CM

## 2024-03-11 MED ORDER — ACCU-CHEK SOFTCLIX LANCETS MISC: 3 refills | Status: DC

## 2024-03-11 MED ORDER — ACCU-CHEK GUIDE TEST VI STRP: ORAL_STRIP | 12 refills | Status: DC

## 2024-03-11 MED ORDER — ACCU-CHEK GUIDE TEST VI STRP: 1.0000 | ORAL_STRIP | Freq: Every day | 12 refills | Status: AC

## 2024-03-11 MED ORDER — METFORMIN HCL 500 MG PO TABS: 500.0000 mg | ORAL_TABLET | Freq: Two times a day (BID) | ORAL | 1 refills | Status: DC

## 2024-03-11 MED ORDER — HYDROCHLOROTHIAZIDE 25 MG PO TABS: 25.0000 mg | ORAL_TABLET | Freq: Every day | ORAL | 1 refills | Status: DC

## 2024-03-11 MED ORDER — ACCU-CHEK SOFTCLIX LANCETS MISC
1.0000 | Freq: Every day | 3 refills | Status: AC
Start: 2024-03-11 — End: ?

## 2024-03-11 NOTE — Telephone Encounter (Signed)
 Walk in patient with [facility] , patient stated she needed a few refills sent to her preferred pharmacy. Patient brought medication and is verified. The metoformin and hydrocholorthiazide has never been prescribed by patients pcp. Sending to patients pcp for further approval.

## 2024-03-12 ENCOUNTER — Encounter: Payer: Self-pay | Admitting: Internal Medicine

## 2024-03-12 LAB — LAB REPORT - SCANNED: Creatinine, POC: 4 mg/dL

## 2024-05-11 LAB — HEPATITIS C ANTIBODY: Hepatitis C Ab: NONREACTIVE

## 2024-05-13 ENCOUNTER — Encounter: Payer: Self-pay | Admitting: Internal Medicine

## 2024-05-13 ENCOUNTER — Non-Acute Institutional Stay: Payer: Self-pay | Admitting: Internal Medicine

## 2024-05-13 VITALS — BP 112/72 | HR 101 | Temp 98.3°F | Ht 68.5 in | Wt 219.0 lb

## 2024-05-13 DIAGNOSIS — K5901 Slow transit constipation: Secondary | ICD-10-CM | POA: Diagnosis not present

## 2024-05-13 DIAGNOSIS — R718 Other abnormality of red blood cells: Secondary | ICD-10-CM | POA: Diagnosis not present

## 2024-05-13 DIAGNOSIS — I10 Essential (primary) hypertension: Secondary | ICD-10-CM

## 2024-05-13 DIAGNOSIS — E1169 Type 2 diabetes mellitus with other specified complication: Secondary | ICD-10-CM | POA: Diagnosis not present

## 2024-05-13 DIAGNOSIS — E782 Mixed hyperlipidemia: Secondary | ICD-10-CM

## 2024-05-13 DIAGNOSIS — Z7984 Long term (current) use of oral hypoglycemic drugs: Secondary | ICD-10-CM

## 2024-05-13 MED ORDER — PRAVASTATIN SODIUM 40 MG PO TABS: 40.0000 mg | ORAL_TABLET | Freq: Every day | ORAL | 5 refills | Status: AC

## 2024-05-13 MED ORDER — AMLODIPINE BESYLATE 5 MG PO TABS: 5.0000 mg | ORAL_TABLET | Freq: Every day | ORAL | 1 refills | Status: AC

## 2024-05-13 NOTE — Patient Instructions (Signed)
 Labs will be done on July 14, 2023 at [facility] at 7:45 am

## 2024-05-13 NOTE — Progress Notes (Signed)
 "  Location:  [facility] Service:  Clinic (12)  Provider:   Code Status:  Goals of Care:     02/13/2024   11:35 AM  Advanced Directives  Does Patient Have a Medical Advance Directive? Yes  Type of Advance Directive Healthcare Power of Attorney  Does patient want to make changes to medical advance directive? No - Patient declined  Copy of Healthcare Power of Attorney in Chart? Yes - validated most recent copy scanned in chart (See row information)     Chief Complaint  Patient presents with   Medical Management of Chronic Issues    Three Months Follow-up with labs, due for colonoscopy     HPI: Patient is a 74 y.o. female seen today for medical management of chronic diseases.    Patient lives in [facility]  for Past 4 years after her husband Died    She has a history of diabetes type 2 follows with ophthalmologist.  Has lost almost 30 pounds on Ozempic.   Hypertension, HLD, history of positive Hemoccult colonoscopy showed polyps precancerous recommended colonoscopy in 3 years    Elevated hemoglobin seen by Dr. Timmy ultrasound was negative for spleen enlargement JAK2 to negative   Patient continues to be stable. She dropped her blood pressure with her and a lot of her blood pressures were in 100s systolic. She denies any dizziness.  She also checks her CBGs at home and most of her fasting blood sugars have been between 100 and 110 Her only complaint is that her weight has plateaued She was not able to do much exercise over the holidays.  She does watch what she eats and is trying to get more active Wt Readings from Last 3 Encounters:  05/13/24 219 lb (99.3 kg)  02/13/24 225 lb (102.1 kg)  02/05/24 217 lb 6.4 oz (98.6 kg)      Past Medical History:  Diagnosis Date   Allergy    Elevated BP 01/30/2015   History of chicken pox    Hyperglycemia 01/30/2015   Hyperlipidemia 01/30/2015   Obesity 01/14/2015    Past Surgical History:   Procedure Laterality Date   BUNIONECTOMY  1995   CESAREAN SECTION  1981   CESAREAN SECTION  1988   TONSILLECTOMY  1960   WRIST SURGERY  2013   Broken right wrist    Allergies[1]  Outpatient Encounter Medications as of 05/13/2024  Medication Sig   Accu-Chek Softclix Lancets lancets 1 each by Other route daily. E11.69   amLODipine  (NORVASC ) 5 MG tablet Take 1 tablet (5 mg total) by mouth daily.   aspirin 81 MG EC tablet Adult Low Dose Aspirin 81 mg tablet,delayed release   1 tablet every day by oral route.   glucose blood (ACCU-CHEK GUIDE TEST) test strip 1 each by Other route daily. E11.69   hydrochlorothiazide  (HYDRODIURIL ) 25 MG tablet Take 1 tablet (25 mg total) by mouth daily.   loratadine (CLARITIN) 10 MG tablet loratadine 10 mg tablet   1 tablet every day by oral route.   metFORMIN  (GLUCOPHAGE ) 500 MG tablet Take 1 tablet (500 mg total) by mouth 2 (two) times daily.   OZEMPIC, 1 MG/DOSE, 4 MG/3ML SOPN 2 mg once a week.   polyethylene glycol powder (GLYCOLAX/MIRALAX) 17 GM/SCOOP powder Take 17 g by mouth daily as needed.   [DISCONTINUED] amLODipine  (NORVASC ) 10 MG tablet Take 1 tablet (10 mg total) by mouth daily.   [DISCONTINUED] pravastatin  (PRAVACHOL ) 40 MG tablet Take  40 mg by mouth daily.   pravastatin  (PRAVACHOL ) 40 MG tablet Take 1 tablet (40 mg total) by mouth daily.   No facility-administered encounter medications on file as of 05/13/2024.    Review of Systems:  Review of Systems  Constitutional:  Negative for activity change and appetite change.  HENT: Negative.    Respiratory:  Negative for cough and shortness of breath.   Cardiovascular:  Negative for leg swelling.  Gastrointestinal:  Negative for constipation.  Genitourinary: Negative.   Musculoskeletal:  Negative for arthralgias, gait problem and myalgias.  Skin: Negative.   Neurological:  Negative for dizziness and weakness.  Psychiatric/Behavioral:  Negative for confusion, dysphoric mood and sleep  disturbance.     Health Maintenance  Topic Date Due   Colonoscopy  06/14/2024   OPHTHALMOLOGY EXAM  07/15/2024   COVID-19 Vaccine (6 - Moderna risk 2025-26 season) 08/08/2024   HEMOGLOBIN A1C  08/13/2024   DTaP/Tdap/Td (2 - Td or Tdap) 01/13/2025   Diabetic kidney evaluation - eGFR measurement  02/12/2025   Diabetic kidney evaluation - Urine ACR  02/12/2025   FOOT EXAM  02/12/2025   Medicare Annual Wellness (AWV)  02/12/2025   Mammogram  01/08/2026   Pneumococcal Vaccine: 50+ Years  Completed   Influenza Vaccine  Completed   Bone Density Scan  Completed   Hepatitis C Screening  Completed   Zoster Vaccines- Shingrix  Completed   Meningococcal B Vaccine  Aged Out    Physical Exam: Vitals:   05/13/24 0817  BP: 112/72  Pulse: (!) 101  Temp: 98.3 F (36.8 C)  SpO2: 98%  Weight: 219 lb (99.3 kg)  Height: 5' 8.5 (1.74 m)   Body mass index is 32.81 kg/m. Physical Exam Vitals reviewed.  Constitutional:      Appearance: Normal appearance.  HENT:     Head: Normocephalic.     Nose: Nose normal.     Mouth/Throat:     Mouth: Mucous membranes are moist.     Pharynx: Oropharynx is clear.  Eyes:     Pupils: Pupils are equal, round, and reactive to light.  Cardiovascular:     Rate and Rhythm: Normal rate and regular rhythm.     Pulses: Normal pulses.     Heart sounds: Normal heart sounds. No murmur heard. Pulmonary:     Effort: Pulmonary effort is normal.     Breath sounds: Normal breath sounds.  Abdominal:     General: Abdomen is flat. Bowel sounds are normal.     Palpations: Abdomen is soft.  Musculoskeletal:        General: No swelling.     Cervical back: Neck supple.  Skin:    General: Skin is warm.  Neurological:     General: No focal deficit present.     Mental Status: She is alert and oriented to person, place, and time.  Psychiatric:        Mood and Affect: Mood normal.        Thought Content: Thought content normal.     Labs reviewed: Basic Metabolic  Panel: Recent Labs    05/15/23 1351 10/29/23 0645 02/13/24 0800  NA 142 138 139  K 3.9 3.5 3.8  CL 100 100 101  CO2 29 28 27   GLUCOSE 108* 103* 91  BUN 13 15 18   CREATININE 0.75 0.74 0.74  CALCIUM 10.2 9.9 9.5  TSH  --  4.33  --    Liver Function Tests: Recent Labs    05/15/23 1351 10/29/23 0645  02/13/24 0800  AST 14* 15 16  ALT 17 19 19   ALKPHOS 88  --   --   BILITOT 0.6 0.5 0.4  PROT 7.7 6.9 6.8  ALBUMIN 4.7  --   --    No results for input(s): LIPASE, AMYLASE in the last 8760 hours. No results for input(s): AMMONIA in the last 8760 hours. CBC: Recent Labs    05/15/23 1351 10/29/23 0645 02/13/24 0800  WBC 10.0 8.7 8.5  NEUTROABS 6.5 4,907 5,083  HGB 15.7* 15.4 15.5  HCT 47.2* 46.7* 46.6*  MCV 88.6 88.8 90.3  PLT 352 380 360   Lipid Panel: Recent Labs    10/29/23 0645  CHOL 136  HDL 57  LDLCALC 60  TRIG 100  CHOLHDL 2.4   Lab Results  Component Value Date   HGBA1C 5.4 02/13/2024    Procedures since last visit: No results found.  Assessment/Plan 1. Type 2 diabetes mellitus with other specified complication, without long-term current use of insulin (HCC) (Primary) A1c controlled On max dose of Ozempic Continue with exercise to help lose weight Stays up-to-date on her eye exams  2. Elevated hematocrit Has seen hematology before we will continue to monitor  3. Essential hypertension BP at home running in 100s Will reduce her Norvasc  to 5 mg She continue to monitor her blood pressure and will let me know if it starts running above 130 4. Mixed hyperlipidemia Doing well on Pravachol   5. Slow transit constipation     Labs/tests ordered:  * No order type specified * Next appt:  Visit date not found        [1] No Known Allergies  "

## 2024-05-18 ENCOUNTER — Ambulatory Visit (HOSPITAL_BASED_OUTPATIENT_CLINIC_OR_DEPARTMENT_OTHER)
Admission: RE | Admit: 2024-05-18 | Discharge: 2024-05-18 | Disposition: A | Discharge status: Home / Self Care | Source: Ambulatory Visit | Attending: Internal Medicine | Admitting: Internal Medicine

## 2024-05-18 DIAGNOSIS — E2839 Other primary ovarian failure: Secondary | ICD-10-CM | POA: Insufficient documentation

## 2024-06-12 ENCOUNTER — Other Ambulatory Visit: Payer: Self-pay | Admitting: Internal Medicine

## 2024-06-12 DIAGNOSIS — E1169 Type 2 diabetes mellitus with other specified complication: Secondary | ICD-10-CM

## 2024-07-15 ENCOUNTER — Encounter: Admitting: Internal Medicine
